# Patient Record
Sex: Male | Born: 1935 | Race: White | Hispanic: No | Smoking: Never smoker
Health system: Southern US, Community
[De-identification: ages and names within clinical notes are randomized; demographics above are authoritative.]

## PROBLEM LIST (undated history)

## (undated) DIAGNOSIS — N183 Chronic kidney disease, stage 3 unspecified: Secondary | ICD-10-CM

## (undated) DIAGNOSIS — K219 Gastro-esophageal reflux disease without esophagitis: Secondary | ICD-10-CM

## (undated) DIAGNOSIS — E119 Type 2 diabetes mellitus without complications: Secondary | ICD-10-CM

## (undated) DIAGNOSIS — Z8659 Personal history of other mental and behavioral disorders: Secondary | ICD-10-CM

## (undated) DIAGNOSIS — I10 Essential (primary) hypertension: Secondary | ICD-10-CM

## (undated) HISTORY — PX: TONSILLECTOMY: SUR1361

## (undated) HISTORY — PX: COLONOSCOPY: SHX174

---

## 2003-11-17 ENCOUNTER — Ambulatory Visit: Payer: Self-pay | Admitting: Internal Medicine

## 2006-03-01 ENCOUNTER — Ambulatory Visit: Payer: Self-pay | Admitting: Gastroenterology

## 2009-03-13 ENCOUNTER — Ambulatory Visit: Payer: Self-pay | Admitting: Internal Medicine

## 2014-05-15 ENCOUNTER — Ambulatory Visit
Admit: 2014-05-15 | Disposition: A | Discharge status: Home / Self Care | Payer: Self-pay | Attending: Family Medicine | Admitting: Family Medicine

## 2014-05-18 ENCOUNTER — Ambulatory Visit
Admit: 2014-05-18 | Disposition: A | Discharge status: Home / Self Care | Payer: Self-pay | Attending: Family Medicine | Admitting: Family Medicine

## 2014-07-03 ENCOUNTER — Ambulatory Visit: Payer: Medicare Other | Admitting: Dietician

## 2014-08-01 ENCOUNTER — Encounter: Payer: Self-pay | Admitting: Dietician

## 2016-08-18 ENCOUNTER — Other Ambulatory Visit: Payer: Self-pay | Admitting: Nephrology

## 2016-08-18 DIAGNOSIS — R188 Other ascites: Secondary | ICD-10-CM

## 2016-08-20 ENCOUNTER — Other Ambulatory Visit: Payer: Self-pay | Admitting: Nephrology

## 2016-08-20 ENCOUNTER — Ambulatory Visit
Admission: RE | Admit: 2016-08-20 | Discharge: 2016-08-20 | Disposition: A | Discharge status: Home / Self Care | Payer: Medicare HMO | Source: Ambulatory Visit | Attending: Nephrology | Admitting: Nephrology

## 2016-08-20 DIAGNOSIS — R188 Other ascites: Secondary | ICD-10-CM

## 2016-09-07 ENCOUNTER — Emergency Department: Payer: Medicare HMO

## 2016-09-07 ENCOUNTER — Emergency Department
Admission: EM | Admit: 2016-09-07 | Discharge: 2016-09-07 | Disposition: A | Discharge status: Home / Self Care | Payer: Medicare HMO | Attending: Emergency Medicine | Admitting: Emergency Medicine

## 2016-09-07 ENCOUNTER — Other Ambulatory Visit: Payer: Self-pay

## 2016-09-07 ENCOUNTER — Encounter: Payer: Self-pay | Admitting: Emergency Medicine

## 2016-09-07 DIAGNOSIS — R55 Syncope and collapse: Secondary | ICD-10-CM | POA: Diagnosis not present

## 2016-09-07 DIAGNOSIS — Y999 Unspecified external cause status: Secondary | ICD-10-CM | POA: Diagnosis not present

## 2016-09-07 DIAGNOSIS — Y929 Unspecified place or not applicable: Secondary | ICD-10-CM | POA: Insufficient documentation

## 2016-09-07 DIAGNOSIS — N189 Chronic kidney disease, unspecified: Secondary | ICD-10-CM | POA: Insufficient documentation

## 2016-09-07 DIAGNOSIS — N289 Disorder of kidney and ureter, unspecified: Secondary | ICD-10-CM

## 2016-09-07 DIAGNOSIS — E871 Hypo-osmolality and hyponatremia: Secondary | ICD-10-CM | POA: Insufficient documentation

## 2016-09-07 DIAGNOSIS — S0993XA Unspecified injury of face, initial encounter: Secondary | ICD-10-CM | POA: Insufficient documentation

## 2016-09-07 DIAGNOSIS — S32591A Other specified fracture of right pubis, initial encounter for closed fracture: Secondary | ICD-10-CM | POA: Insufficient documentation

## 2016-09-07 DIAGNOSIS — Y939 Activity, unspecified: Secondary | ICD-10-CM | POA: Diagnosis not present

## 2016-09-07 DIAGNOSIS — E1165 Type 2 diabetes mellitus with hyperglycemia: Secondary | ICD-10-CM | POA: Diagnosis not present

## 2016-09-07 DIAGNOSIS — W010XXA Fall on same level from slipping, tripping and stumbling without subsequent striking against object, initial encounter: Secondary | ICD-10-CM | POA: Diagnosis not present

## 2016-09-07 DIAGNOSIS — R739 Hyperglycemia, unspecified: Secondary | ICD-10-CM

## 2016-09-07 DIAGNOSIS — S3993XA Unspecified injury of pelvis, initial encounter: Secondary | ICD-10-CM | POA: Diagnosis present

## 2016-09-07 DIAGNOSIS — R4020 Unspecified coma: Secondary | ICD-10-CM

## 2016-09-07 HISTORY — DX: Type 2 diabetes mellitus without complications: E11.9

## 2016-09-07 LAB — BASIC METABOLIC PANEL
ANION GAP: 8 (ref 5–15)
BUN: 41 mg/dL — ABNORMAL HIGH (ref 6–20)
CALCIUM: 9.1 mg/dL (ref 8.9–10.3)
CO2: 26 mmol/L (ref 22–32)
Chloride: 99 mmol/L — ABNORMAL LOW (ref 101–111)
Creatinine, Ser: 1.88 mg/dL — ABNORMAL HIGH (ref 0.61–1.24)
GFR, EST AFRICAN AMERICAN: 36 mL/min — AB (ref >60)
GFR, EST NON AFRICAN AMERICAN: 31 mL/min — AB (ref >60)
Glucose, Bld: 376 mg/dL — ABNORMAL HIGH (ref 65–99)
Potassium: 4.5 mmol/L (ref 3.5–5.1)
Sodium: 133 mmol/L — ABNORMAL LOW (ref 135–145)

## 2016-09-07 LAB — URINALYSIS, COMPLETE (UACMP) WITH MICROSCOPIC
BILIRUBIN URINE: NEGATIVE
Bacteria, UA: NONE SEEN
Glucose, UA: >=500 mg/dL — AB
HGB URINE DIPSTICK: NEGATIVE
KETONES UR: NEGATIVE mg/dL
LEUKOCYTES UA: NEGATIVE
NITRITE: NEGATIVE
PROTEIN: NEGATIVE mg/dL
Specific Gravity, Urine: 1.017 (ref 1.005–1.030)
Squamous Epithelial / LPF: NONE SEEN
pH: 5 (ref 5.0–8.0)

## 2016-09-07 LAB — CBC
HCT: 40.1 % (ref 40.0–52.0)
HEMOGLOBIN: 13.9 g/dL (ref 13.0–18.0)
MCH: 33 pg (ref 26.0–34.0)
MCHC: 34.7 g/dL (ref 32.0–36.0)
MCV: 95.3 fL (ref 80.0–100.0)
Platelets: 151 10*3/uL (ref 150–440)
RBC: 4.21 MIL/uL — AB (ref 4.40–5.90)
RDW: 14.4 % (ref 11.5–14.5)
WBC: 7.4 10*3/uL (ref 3.8–10.6)

## 2016-09-07 LAB — TROPONIN I

## 2016-09-07 MED ORDER — ACETAMINOPHEN 500 MG PO TABS
1000.0000 mg | ORAL_TABLET | Freq: Once | ORAL | Status: AC
  Administered 2016-09-07: 1000 mg via ORAL
  Filled 2016-09-07: qty 2

## 2016-09-07 NOTE — ED Provider Notes (Signed)
[facility] Emergency Department Provider Note  ____________________________________________  Time seen: Approximately 2:08 PM  I have reviewed the triage vital signs and the nursing notes.   HISTORY  Chief Complaint Hip Pain    HPI Terry Macias is a 83 y.o. male with a history of diabetes who lives alone presenting for continued pelvic pain after fall. The patient reports that one week ago, he went outdoors to get a newspaper and sustained a fall. He does not remember the fall, does not remember the 6 hours after the fall. He remembers waking up in his bed and did not know what happened area at that time, he noted that he had a black eye, and pain in the right groin area. He did not seek any medical attention. He denies any associated chest pain, palpitations, lightheadedness, nausea or vomiting, recent changes in medications. He has not been having any headache, numbness tingling or weakness. Today, the patient presents because of ongoing discomfort when he weightbears that is most localized in the right inguinal crease.   Past Medical History:  Diagnosis Date  . Diabetes mellitus without complication (HCC)     There are no active problems to display for this patient.   No past surgical history on file.  Current Outpatient Rx  . Order #: [id number] Class: Historical Med  . Order #: [id number] Class: Historical Med  . Order #: [id number] Class: Historical Med  . Order #: [id number] Class: Historical Med  . Order #: [id number] Class: Historical Med    Allergies Patient has no known allergies.  No family history on file.  Social History Social History  Substance Use Topics  . Smoking status: Not on file  . Smokeless tobacco: Not on file  . Alcohol use Not on file    Review of Systems Constitutional: No fever/chills.No lightheadedness. Positive loss of consciousness one week ago. Eyes: No visual changes. No blurred or double vision. Positive  ecchymosis of the right eye after fall. ENT: No sore throat. No congestion or rhinorrhea. No ear pain. Cardiovascular: Denies chest pain. Denies palpitations. Respiratory: Denies shortness of breath.  No cough. Gastrointestinal: No abdominal pain.  No nausea, no vomiting.  No diarrhea.  No constipation. Genitourinary: Negative for dysuria. Musculoskeletal: Negative for back pain. No neck pain. Positive right groin pain. Skin: Negative for rash. Neurological: Negative for headaches. No focal numbness, tingling or weakness. No unsteady gait. No changes in vision or speech. No changes in mental status.    ____________________________________________   PHYSICAL EXAM:  VITAL SIGNS: ED Triage Vitals [09/07/16 1110]  Enc Vitals Group     BP 118/71     Pulse Rate 90     Resp 16     Temp 98.4 F (36.9 C)     Temp Source Oral     SpO2 100 %     Weight 156 lb (70.8 kg)     Height 5\' 9"  (1.753 m)     Head Circumference      Peak Flow      Pain Score 9     Pain Loc      Pain Edu?      Excl. in GC?     Constitutional: Alert and oriented. Well appearing and in no acute distress. Answers questions appropriately.GCS is 15 Eyes: Conjunctivae are normal.  EOMI. No scleral icterus. Positive ecchymosis with old yellow and green discoloration around the right orbit. No evidence of extraocular entrapment. PERRLA. Head: No Battle sign. See exam above. Nose: No congestion/rhinnorhea.  No swelling over the nose. No septal hematoma. Mouth/Throat: Mucous membranes are moist. No dental injury or malocclusion. Neck: No stridor.  Supple.  No midline C-spine tenderness to palpation, step-offs or deformities. Full range of motion without pain. Cardiovascular: Normal rate, regular rhythm. No murmurs, rubs or gallops.  Respiratory: Normal respiratory effort.  No accessory muscle use or retractions. Lungs CTAB.  No wheezes, rales or ronchi. Gastrointestinal: Soft, nontender and nondistended.  No guarding or  rebound.  No peritoneal signs. Musculoskeletal: The patient has full range of motion of the left hip, bilateral knees, and bilateral ankles without pain. He does have some pain with range of motion of the right hip. He does not have any pain over the right greater trochanter but does have pain with palpation over the right inguinal crease. The patient has normal right DP and PT pulses. Normal sensation to light touch in the right lower extremity. No evidence of upper extremity injury. No midline C-spine, T-spine, or L-spine tenderness to palpation, step-offs or deformities. No LE edema. No ttp in the calves or palpable cords.  Negative Homan's sign. Neurologic:  A&Ox3.  Speech is clear.  Face and smile are symmetric.  EOMI.  Moves all extremities well. Skin:  Skin is warm, dry and intact. No rash noted. Psychiatric: Mood and affect are normal. Speech and behavior are normal.  Normal judgement.  ____________________________________________   LABS (all labs ordered are listed, but only abnormal results are displayed)  Labs Reviewed  BASIC METABOLIC PANEL - Abnormal; Notable for the following:       Result Value   Sodium 133 (*)    Chloride 99 (*)    Glucose, Bld 376 (*)    BUN 41 (*)    Creatinine, Ser 1.88 (*)    GFR calc non Af Amer 31 (*)    GFR calc Af Amer 36 (*)    All other components within normal limits  CBC - Abnormal; Notable for the following:    RBC 4.21 (*)    All other components within normal limits  URINALYSIS, COMPLETE (UACMP) WITH MICROSCOPIC - Abnormal; Notable for the following:    Color, Urine YELLOW (*)    APPearance CLEAR (*)    Glucose, UA >=500 (*)    All other components within normal limits  TROPONIN I   ____________________________________________  EKG  ED ECG REPORT I, Rockne MenghiniNorman, Anne-Caroline, the attending physician, personally viewed and interpreted this ECG.   Date: 09/07/2016  EKG Time: 1327  Rate: 66  Rhythm: normal sinus rhythm; + RBBB  Axis:  normal  Intervals:none  ST&T Change: No STEMI  ____________________________________________  RADIOLOGY  Dg Chest 2 View  Result Date: 09/07/2016 CLINICAL DATA:  83 year old who fell approximately 1 week ago, complaining of persistent chest pain. Patient amnestic to the fall. Initial imaging encounter. EXAM: CHEST  2 VIEW COMPARISON:  None. FINDINGS: Cardiac silhouette normal in size. Thoracic aorta tortuous and atherosclerotic. Hilar and mediastinal contours otherwise unremarkable. Mild hyperinflation with increase in the AP diameter of the chest. Linear scar or atelectasis in the lower lobes bilaterally and in the right middle lobe. Lungs otherwise clear. Pulmonary vascularity normal. Bronchovascular markings normal. No pleural effusions. Exaggeration of the usual thoracic kyphosis. IMPRESSION: 1. Mild hyperinflation indicating COPD and/or asthma. Linear scarring or atelectasis in the lower lobes and right middle lobe. No acute cardiopulmonary disease otherwise. 2. Thoracic aortic atherosclerosis. Electronically Signed   By: Hulan Saashomas  Lawrence M.D.   On: 09/07/2016 12:22   Ct Head  Wo Contrast  Result Date: 09/07/2016 CLINICAL DATA:  Pt reports fall 1 week ago, continued pain to right hip with walking. Pt also reports hitting head, old bruising noted to right eye. Pt reports waking up around 6am and getting the newpaper, then reports waking up in bed around 1.*comment was truncated* EXAM: CT HEAD WITHOUT CONTRAST CT CERVICAL SPINE WITHOUT CONTRAST TECHNIQUE: Multidetector CT imaging of the head and cervical spine was performed following the standard protocol without intravenous contrast. Multiplanar CT image reconstructions of the cervical spine were also generated. COMPARISON:  None. FINDINGS: CT HEAD FINDINGS Brain: Mild generalized age related parenchymal atrophy with commensurate dilatation of the ventricles and sulci. No mass, hemorrhage, edema or other evidence of acute parenchymal abnormality. No  extra-axial hemorrhage. Vascular: There are chronic calcified atherosclerotic changes of the large vessels at the skull base. No unexpected hyperdense vessel. Skull: Normal. Negative for fracture or focal lesion. Sinuses/Orbits: No acute finding. Other: None. CT CERVICAL SPINE FINDINGS Alignment: Normal.  No vertebral body subluxation. Skull base and vertebrae: No fracture line or displaced fracture fragment identified. Facet joints appear intact and normally aligned throughout. Soft tissues and spinal canal: No prevertebral fluid or swelling. No visible canal hematoma. Disc levels: Mild disc desiccations within the lower cervical spine. No significant central canal stenosis seen at any level. Upper chest: Negative. Other: None. IMPRESSION: 1. No acute intracranial abnormality. No intracranial mass, hemorrhage or edema. No skull fracture. 2. No fracture or acute subluxation within the cervical spine. Electronically Signed   By: Bary Richard M.D.   On: 09/07/2016 11:37   Ct Cervical Spine Wo Contrast  Result Date: 09/07/2016 CLINICAL DATA:  Pt reports fall 1 week ago, continued pain to right hip with walking. Pt also reports hitting head, old bruising noted to right eye. Pt reports waking up around 6am and getting the newpaper, then reports waking up in bed around 1.*comment was truncated* EXAM: CT HEAD WITHOUT CONTRAST CT CERVICAL SPINE WITHOUT CONTRAST TECHNIQUE: Multidetector CT imaging of the head and cervical spine was performed following the standard protocol without intravenous contrast. Multiplanar CT image reconstructions of the cervical spine were also generated. COMPARISON:  None. FINDINGS: CT HEAD FINDINGS Brain: Mild generalized age related parenchymal atrophy with commensurate dilatation of the ventricles and sulci. No mass, hemorrhage, edema or other evidence of acute parenchymal abnormality. No extra-axial hemorrhage. Vascular: There are chronic calcified atherosclerotic changes of the large  vessels at the skull base. No unexpected hyperdense vessel. Skull: Normal. Negative for fracture or focal lesion. Sinuses/Orbits: No acute finding. Other: None. CT CERVICAL SPINE FINDINGS Alignment: Normal.  No vertebral body subluxation. Skull base and vertebrae: No fracture line or displaced fracture fragment identified. Facet joints appear intact and normally aligned throughout. Soft tissues and spinal canal: No prevertebral fluid or swelling. No visible canal hematoma. Disc levels: Mild disc desiccations within the lower cervical spine. No significant central canal stenosis seen at any level. Upper chest: Negative. Other: None. IMPRESSION: 1. No acute intracranial abnormality. No intracranial mass, hemorrhage or edema. No skull fracture. 2. No fracture or acute subluxation within the cervical spine. Electronically Signed   By: Bary Richard M.D.   On: 09/07/2016 11:37   Ct Hip Right Wo Contrast  Result Date: 09/07/2016 CLINICAL DATA:  Right groin pain secondary to a fall 5 days ago. EXAM: CT OF THE RIGHT HIP WITHOUT CONTRAST TECHNIQUE: Multidetector CT imaging of the right hip was performed according to the standard protocol. Multiplanar CT image reconstructions were also  generated. COMPARISON:  Radiographs dated 09/07/2016 FINDINGS: Bones/Joint/Cartilage There are minimally displaced oblique fractures of the right inferior and superior pubic rami. Proximal right femur is intact. Acetabulum is intact. No joint effusion. Soft tissues There is slight hemorrhage into the soft tissues around the adductor muscles of the right hip adjacent to the inferior pubic ramus fracture. IMPRESSION: Slightly displaced oblique fractures of the right inferior and superior pubic rami. Electronically Signed   By: Francene Boyers M.D.   On: 09/07/2016 14:23   Dg Hip Unilat  With Pelvis 2-3 Views Right  Result Date: 09/07/2016 CLINICAL DATA:  Right hip pain. EXAM: DG HIP (WITH OR WITHOUT PELVIS) 2-3V RIGHT COMPARISON:  None.  FINDINGS: Hip joints and SI joints are symmetric and unremarkable. No acute bony abnormality. Specifically, no fracture, subluxation, or dislocation. Soft tissues are intact. IMPRESSION: No acute bony abnormality. Electronically Signed   By: Charlett Nose M.D.   On: 09/07/2016 12:22    ____________________________________________   PROCEDURES  Procedure(s) performed: None  Procedures  Critical Care performed: No ____________________________________________   INITIAL IMPRESSION / ASSESSMENT AND PLAN / ED COURSE  Pertinent labs & imaging results that were available during my care of the patient were reviewed by me and considered in my medical decision making (see chart for details).  83 y.o. male with diabetes presenting with ongoing right groin pain after a fall last week. Will get a CT scan for further evaluation because the patient's right hip x-ray does not show any acute fracture and I'm concerned about a possible occult fracture, particularly a pelvic fracture given the patient's description and physical exam findings. I am concerned that the patient has a 6 hour period of loss of consciousness, however, this occurred one week ago and he has not had any additional symptoms. Here, he is hemodynamically stable and has no focal neurologic deficits. It no time did he experience any chest pain, shortness of breath or other red flag warnings. Will do a screening EKG, labs including troponin, and reevaluate the patient for final disposition.  ----------------------------------------- 2:57 PM on 09/07/2016 ----------------------------------------- The patient's laboratory studies do show some hyper glycemia without DKA. He is minimally hyponatremic and has received intravenous fluids for this. This will also help his hypoglycemia. He in addition has some mild renal insufficiency. His blood counts does not show any anemia. He does not have a UTI.  History of the workup does show an inferior and  superior pubic ramus fracture on the right. I spoke with Dr. Ernest Pine, the orthopedic surgeon on-call, who recommends weight-bear as tolerated and done as needed. He may follow up with his PCP or orthopedics as needed. His remaining trauma workup is negative, including a reassuring chest x-ray, CT head and CT C-spine.  At this time, the patient is stable for discharge. He understands return precautions as well as follow-up instructions.   ____________________________________________  FINAL CLINICAL IMPRESSION(S) / ED DIAGNOSES  Final diagnoses:  Closed fracture of ramus of right pubis, initial encounter (HCC)  Loss of consciousness (HCC)  Facial injury, initial encounter  Hyperglycemia  Hyponatremia  Renal insufficiency         NEW MEDICATIONS STARTED DURING THIS VISIT:  New Prescriptions   No medications on file      Rockne Menghini, MD 09/07/16 1459

## 2016-09-07 NOTE — ED Notes (Signed)
The EKG was completed and signed by Dr. Malinda. The EKG was also exported into the system. 

## 2016-09-07 NOTE — ED Notes (Addendum)
Awaiting troponin level prior to discharge - called lab and troponin is in process

## 2016-09-07 NOTE — Discharge Instructions (Signed)
Today you have to parts of your pelvis that are fractured. These do not need surgery, or any kind of immobilization. You may weight-bear as tolerated but please make sure you are treating her pain well so that you do not fall due to pain. If you have pain when you're sitting, you may use a doughnut shaped pillow. He may follow up with your primary care physician, or with the orthopedist if you wish.  Please drink plenty of fluid over the next couple of days. Please have your primary care physician recheck your kidney function, your blood sugar, and your sodium level.  We did not find any cause for your loss of consciousness today. Please make an appointment with your primary care physician tomorrow to let him know, and for full evaluation.  Return to the emergency department if you develop lightheadedness or fainting, severe pain, shortness of breath, or any other symptoms concerning to you.

## 2016-09-07 NOTE — ED Notes (Signed)
Patient transported to CT 

## 2016-09-07 NOTE — ED Triage Notes (Signed)
Pt reports fall 1 week ago, continued pain to right hip with walking. Pt also reports hitting head, old bruising noted to right eye. Pt reports waking up around 6am and getting the newpaper, then reports waking up in bed around 1200 noon with the hit pain and black eye.

## 2016-09-07 NOTE — ED Notes (Signed)
Approx 1 week ago pt was outside getting newspaper and he next remembers he was in the bed with pain in right hip and black eye to right side. Pt reports pain with ambulation, pt able to move right hip.

## 2016-09-07 NOTE — ED Notes (Signed)
Called lab to follow up on troponin level and was told tht it would be 20 more min

## 2018-02-23 IMAGING — CR DG HIP (WITH OR WITHOUT PELVIS) 2-3V*R*
1 series · 3 of 3 positions shown · non-contrast
Comparison: None.

CLINICAL DATA: Right hip pain.

EXAM:
DG HIP (WITH OR WITHOUT PELVIS) 2-3V RIGHT

[Series 1: dg hip unilat w or w/o pelvis 2-3 views  · non-contrast · 0.14mm/px · 3 of 3 slices shown]
[im 1/3]
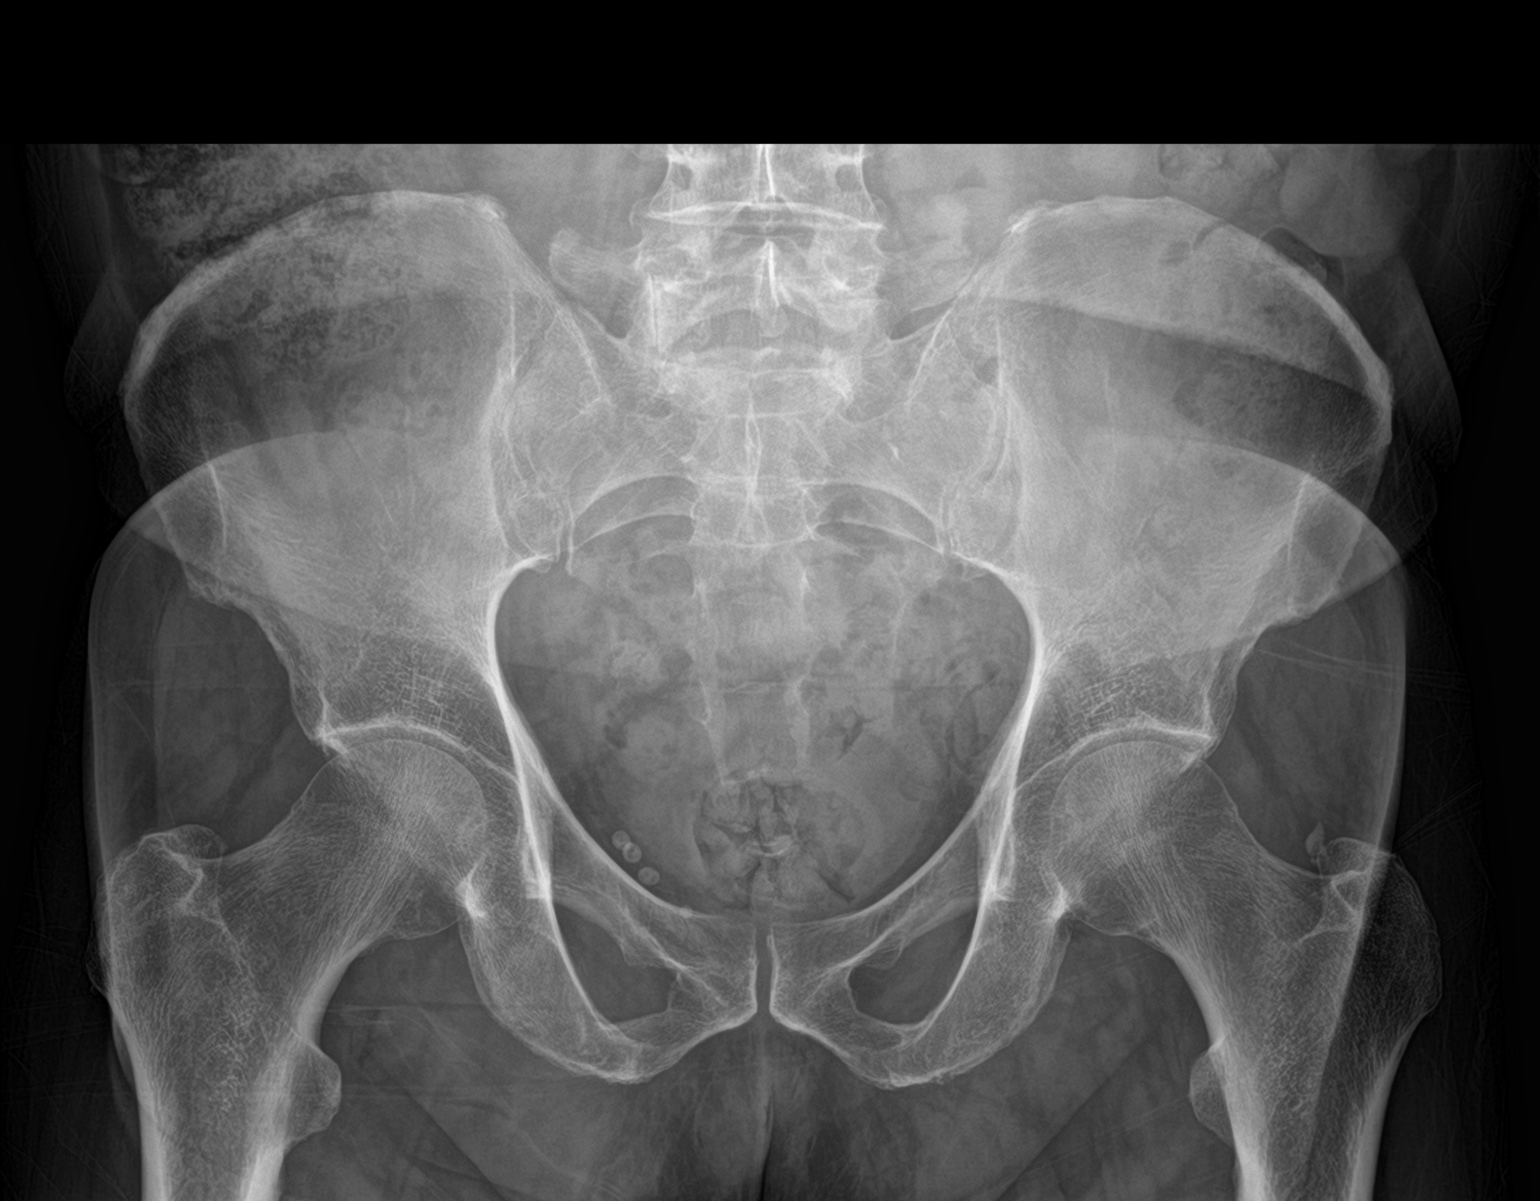
[im 2/3]
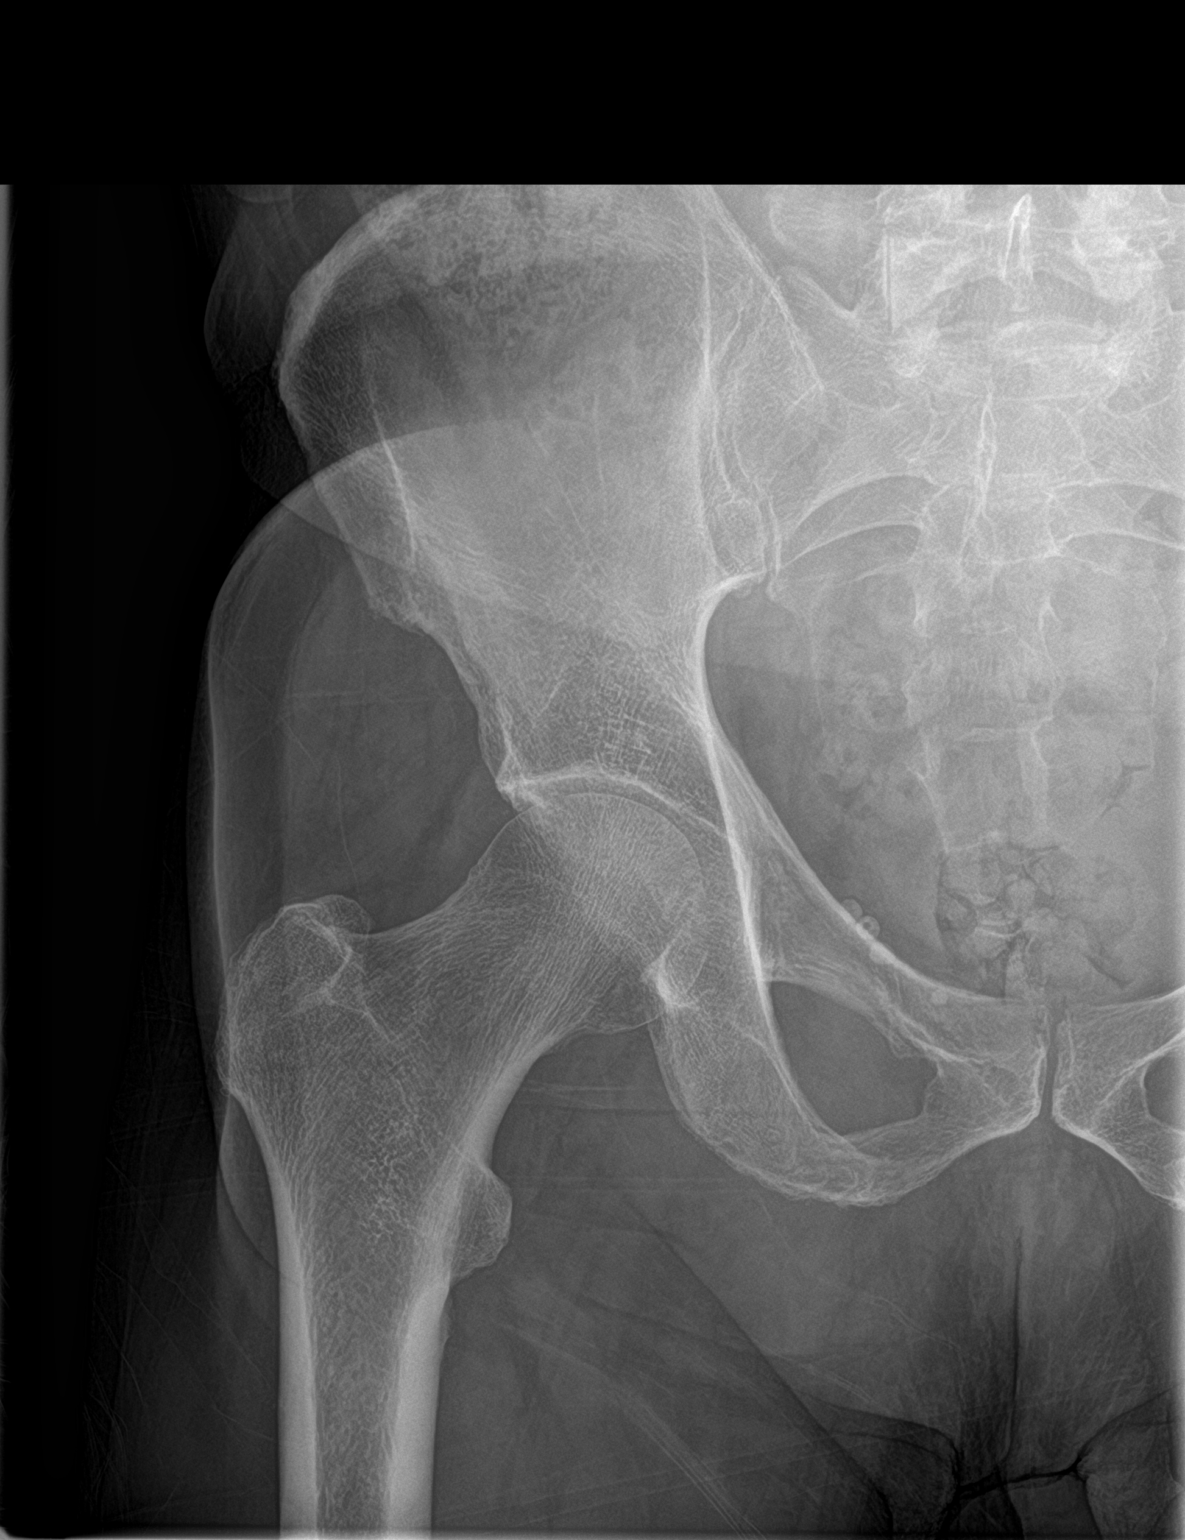
[im 3/3]
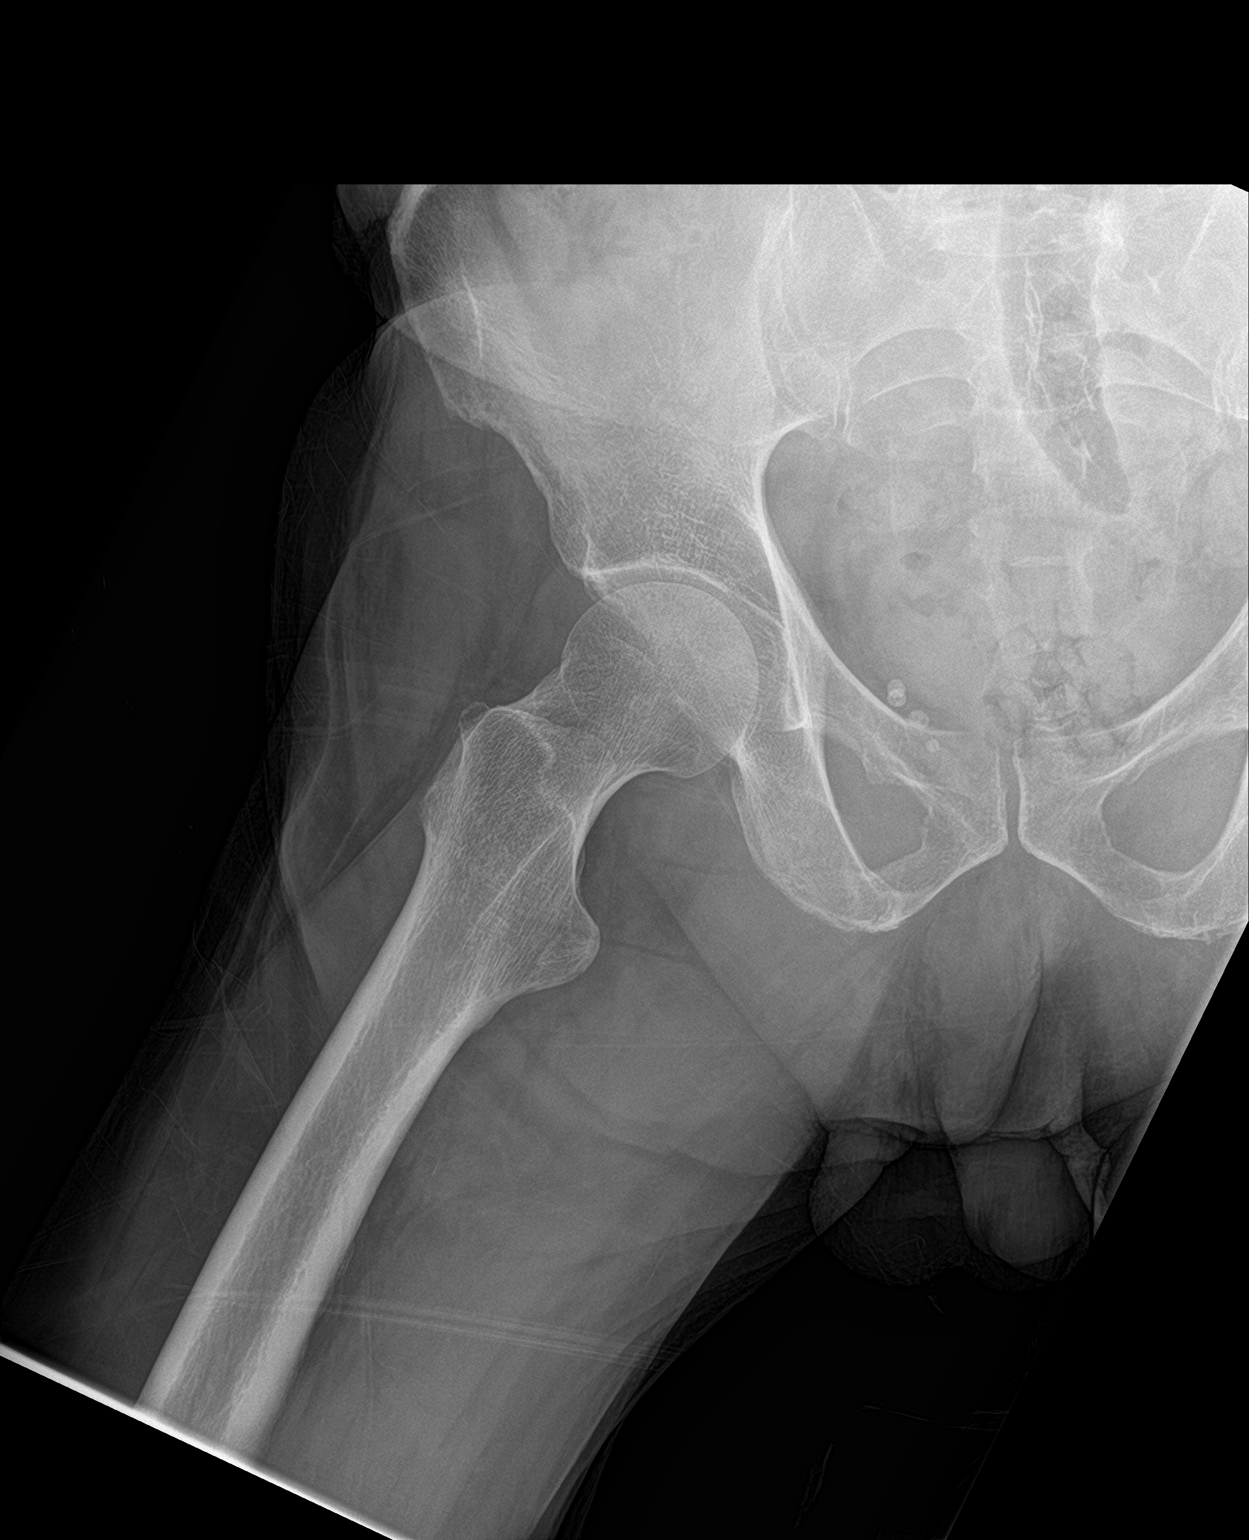

[3 of 3 positions shown; findings below may reference images not displayed]

FINDINGS: Hip joints and SI joints are symmetric and unremarkable. No acute
bony abnormality. Specifically, no fracture, subluxation, or
dislocation. Soft tissues are intact.
IMPRESSION: No acute bony abnormality.

## 2018-02-23 IMAGING — CR DG CHEST 2V
1 series · 2 of 2 positions shown · non-contrast
Comparison: None.

CLINICAL DATA: 86-year-old who fell approximately 1 week ago,
complaining of persistent chest pain. Patient amnestic to the fall.
Initial imaging encounter.

EXAM:
CHEST  2 VIEW

[Series 1: dg chest 2 view · 0.14mm/px · 2 of 2 slices shown]
[im 1/2]
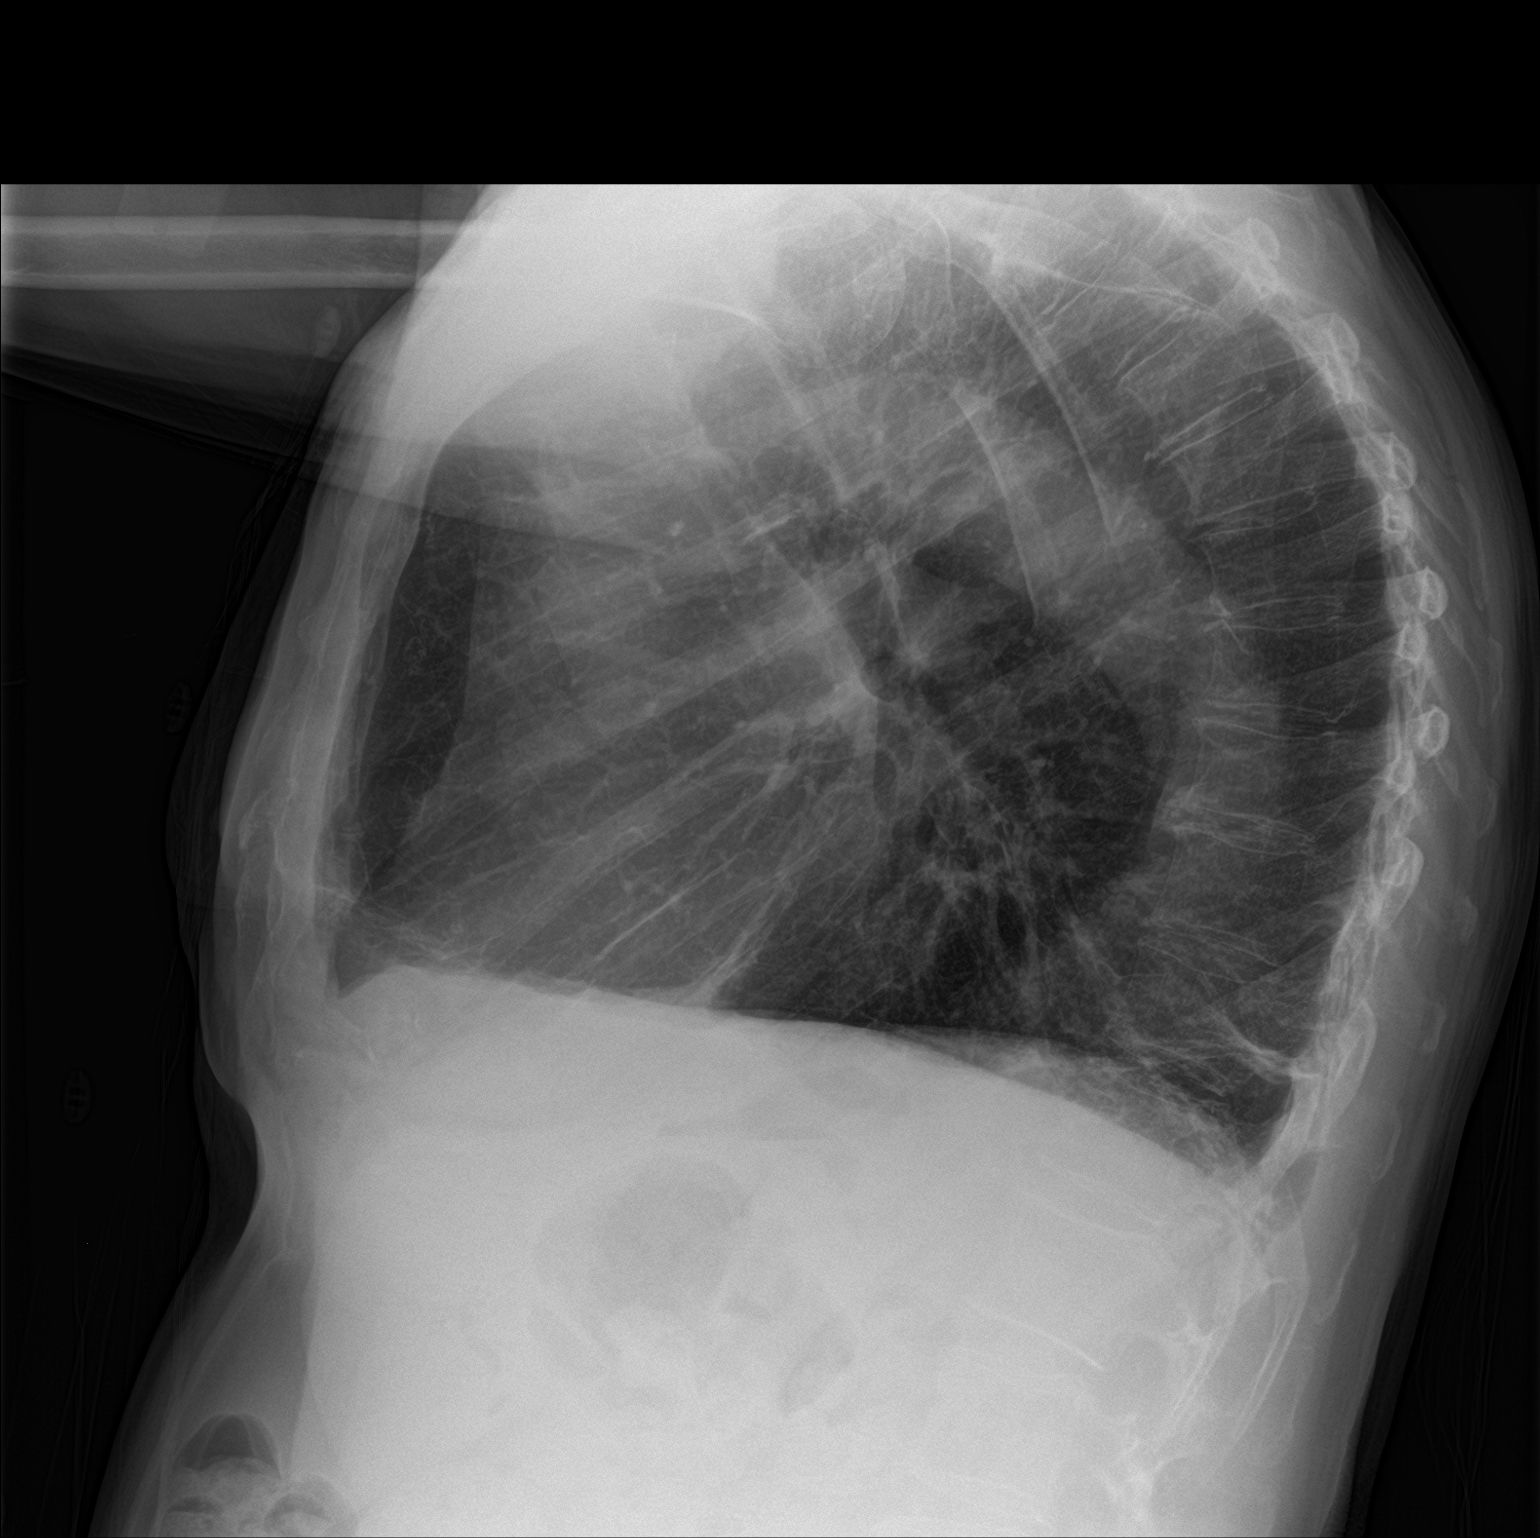
[im 2/2]
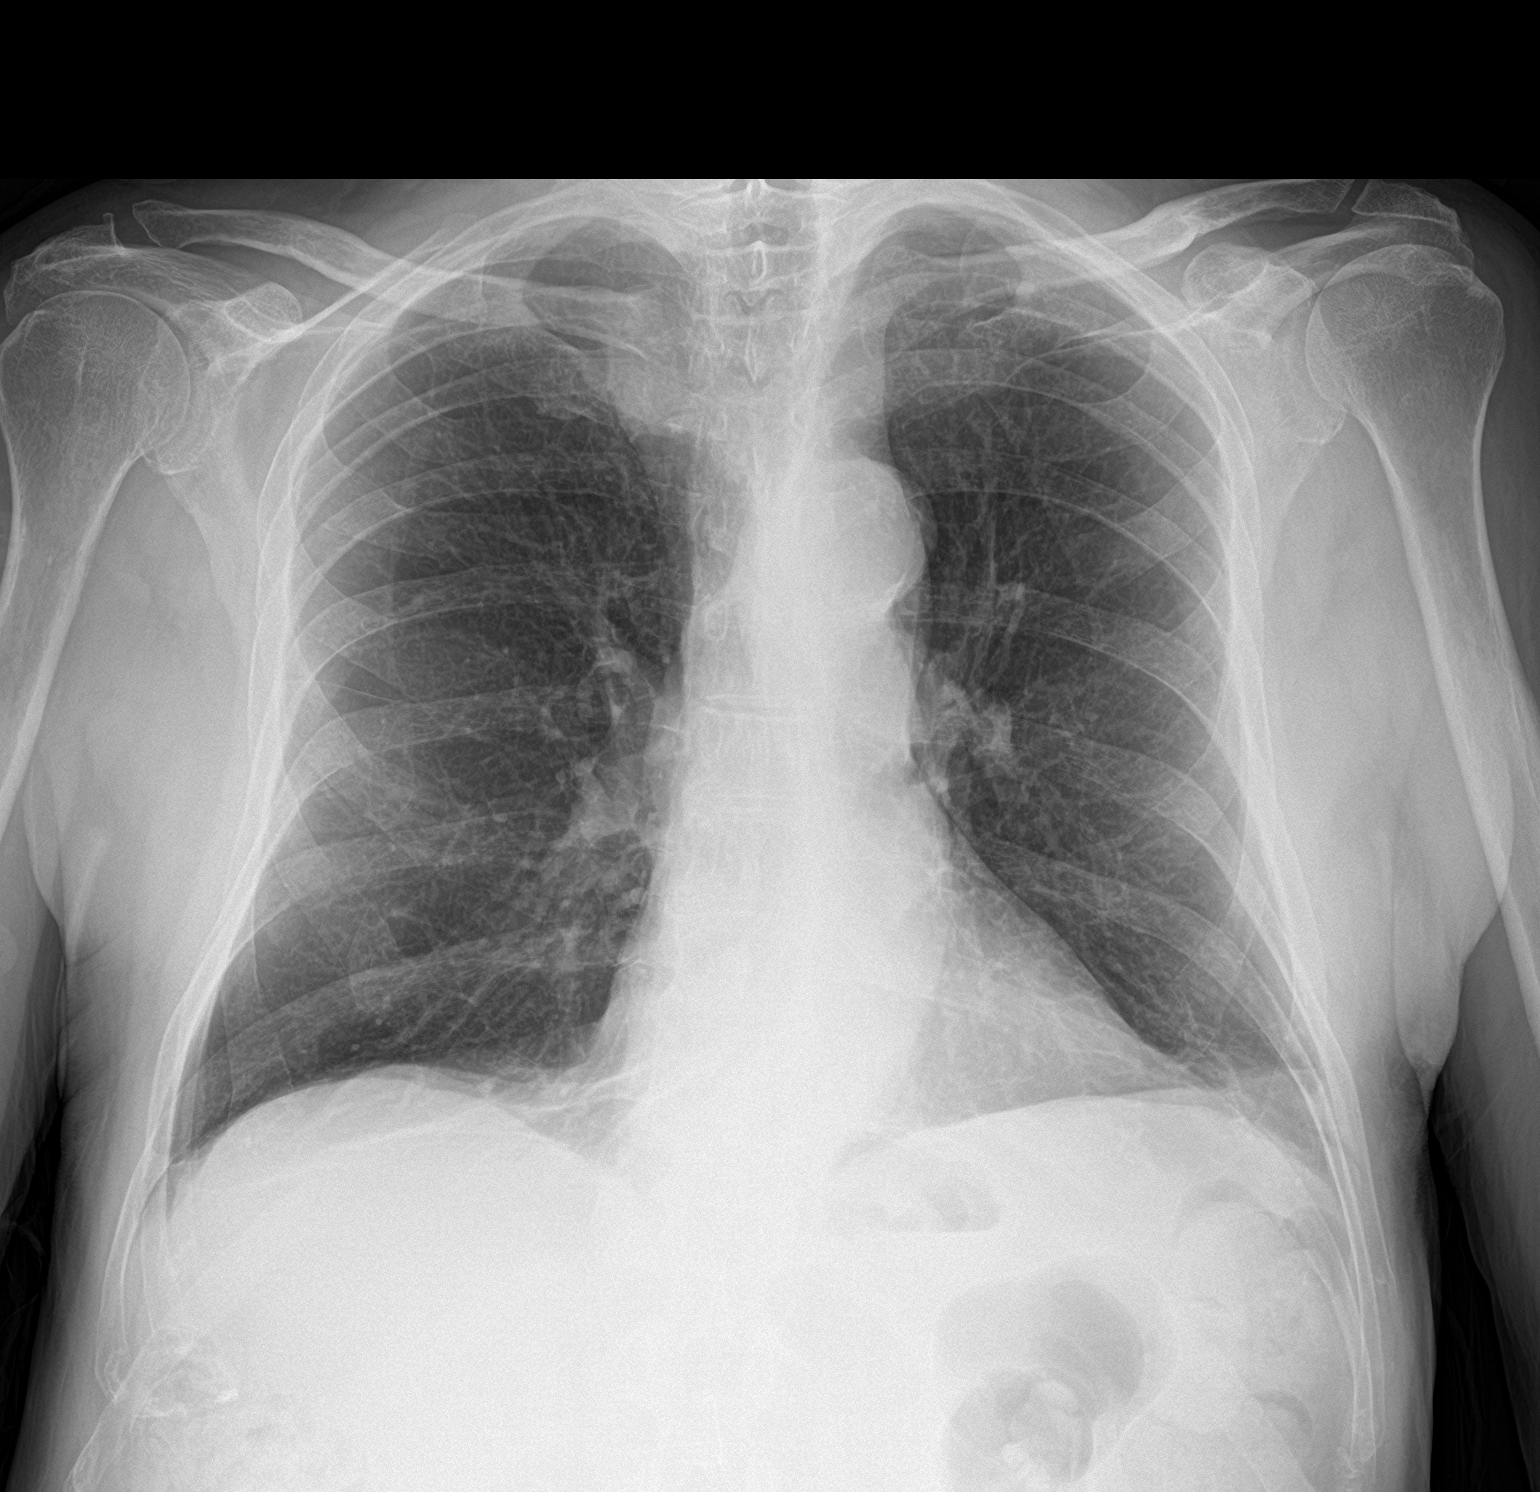

[2 of 2 positions shown; findings below may reference images not displayed]

FINDINGS: Cardiac silhouette normal in size. Thoracic aorta tortuous and
atherosclerotic. Hilar and mediastinal contours otherwise
unremarkable. Mild hyperinflation with increase in the AP diameter
of the chest. Linear scar or atelectasis in the lower lobes
bilaterally and in the right middle lobe. Lungs otherwise clear.
Pulmonary vascularity normal. Bronchovascular markings normal. No
pleural effusions. Exaggeration of the usual thoracic kyphosis.
IMPRESSION: 1. Mild hyperinflation indicating COPD and/or asthma. Linear
scarring or atelectasis in the lower lobes and right middle lobe. No
acute cardiopulmonary disease otherwise.
2. Thoracic aortic atherosclerosis.

## 2020-02-12 ENCOUNTER — Other Ambulatory Visit: Payer: Self-pay | Admitting: Family Medicine

## 2020-02-12 DIAGNOSIS — R1903 Right lower quadrant abdominal swelling, mass and lump: Secondary | ICD-10-CM

## 2020-02-13 ENCOUNTER — Ambulatory Visit
Admission: RE | Admit: 2020-02-13 | Discharge: 2020-02-13 | Disposition: A | Discharge status: Home / Self Care | Payer: Medicare HMO | Source: Ambulatory Visit | Attending: Family Medicine | Admitting: Family Medicine

## 2020-02-13 ENCOUNTER — Other Ambulatory Visit: Payer: Self-pay

## 2020-02-13 DIAGNOSIS — R1903 Right lower quadrant abdominal swelling, mass and lump: Secondary | ICD-10-CM | POA: Diagnosis present

## 2020-02-15 ENCOUNTER — Ambulatory Visit: Payer: Self-pay | Admitting: Surgery

## 2020-02-15 NOTE — H&P (Signed)
Subjective:   CC: Non-recurrent bilateral inguinal hernia without obstruction or gangrene [K40.20]  HPI:  Terry Macias is a 86 y.o. male who was referred by Marisue Ivan, MD for evaluation of above. Asymptomatic but noticed bulge few days ago. Lump is reducible.    Past Medical History:  has a past medical history of Chronic kidney disease (can"t recall), Depression, Diabetes (CMS-HCC), GERD (gastroesophageal reflux disease), History of diverticulosis, and Hypertension.  Past Surgical History:  Past Surgical History:  Procedure Laterality Date  . COLONOSCOPY  January 2008  . TONSILLECTOMY      Family History: family history includes Diabetes in his mother; Diabetes type II in his mother; Heart disease in his brother, brother, and mother; Heart valve disease in his mother; Lung cancer in his brother; Stroke in his father.  Social History:  reports that he quit smoking about 37 years ago. His smoking use included cigarettes. He started smoking about 52 years ago. He smoked 0.00 packs per day for 15.00 years. He has never used smokeless tobacco. He reports that he does not drink alcohol and does not use drugs.  Current Medications: has a current medication list which includes the following prescription(s): alpha lipoic acid, amlodipine, aspirin, atorvastatin, atorvastatin, cholecalciferol, famotidine, flash glucose scanning, flash glucose sensor, glimepiride, insulin aspart, levemir flextouch u-100 insuln, lisinopril, onetouch ultra blue test strip, paroxetine, and ulticare pen needle.  Allergies:  Allergies as of 02/15/2020 - Reviewed 02/15/2020  Allergen Reaction Noted  . Trulicity [dulaglutide] Other (See Comments) 10/04/2017    ROS:  A 15 point review of systems was performed and pertinent positives and negatives noted in HPI   Objective:     BP 151/81   Pulse 87   Ht 166.9 cm (5' 5.7")   Wt 68.9 kg (152 lb)   BMI 24.76 kg/m   Constitutional :  alert, appears  stated age, cooperative and no distress  Lymphatics/Throat:  no asymmetry, masses, or scars  Respiratory:  clear to auscultation bilaterally  Cardiovascular:  regular rate and rhythm  Gastrointestinal: soft, non-tender; bowel sounds normal; no masses,  no organomegaly. inguinal hernia noted.  large, reducible, no overlying skin changes and right side.  very small left side  Musculoskeletal: Steady gait and movement  Skin: Cool and moist  Psychiatric: Normal affect, non-agitated, not confused       LABS:  n/a   RADS: n/a Assessment:       Non-recurrent bilateral inguinal hernia without obstruction or gangrene [K40.20] R>L  Plan:     1. Non-recurrent bilateral inguinal hernia without obstruction or gangrene [K40.20]   Discussed the risk of surgery including recurrence, which can be up to 50% in the case of incisional or complex hernias, possible use of prosthetic materials (mesh) and the increased risk of mesh infxn if used, bleeding, chronic pain, post-op infxn, post-op SBO or ileus, and possible re-operation to address said risks. The risks of general anesthetic, if used, includes MI, CVA, sudden death or even reaction to anesthetic medications also discussed. Alternatives include continued observation.  Benefits include possible symptom relief, prevention of incarceration, strangulation, enlargement in size over time, and the risk of emergency surgery in the face of strangulation.   Typical post-op recovery time of 3-5 days with 2 weeks of activity restrictions were also discussed.  ED return precautions given for sudden increase in pain, size of hernia with accompanying fever, nausea, and/or vomiting.  The patient verbalized understanding and all questions were answered to the patient's satisfaction.   2.  Patient has elected to proceed with surgical treatment. Procedure will be scheduled.  Written consent was obtained.. bilateral, robotic assisted laparoscopic.  Will only fix right  side if we need to covert to open due to size    

## 2020-02-15 NOTE — H&P (View-Only) (Signed)
Subjective:   CC: Non-recurrent bilateral inguinal hernia without obstruction or gangrene [K40.20]  HPI:  Terry Macias is a 86 y.o. male who was referred by Marisue Ivan, MD for evaluation of above. Asymptomatic but noticed bulge few days ago. Lump is reducible.    Past Medical History:  has a past medical history of Chronic kidney disease (can"t recall), Depression, Diabetes (CMS-HCC), GERD (gastroesophageal reflux disease), History of diverticulosis, and Hypertension.  Past Surgical History:  Past Surgical History:  Procedure Laterality Date  . COLONOSCOPY  January 2008  . TONSILLECTOMY      Family History: family history includes Diabetes in his mother; Diabetes type II in his mother; Heart disease in his brother, brother, and mother; Heart valve disease in his mother; Lung cancer in his brother; Stroke in his father.  Social History:  reports that he quit smoking about 37 years ago. His smoking use included cigarettes. He started smoking about 52 years ago. He smoked 0.00 packs per day for 15.00 years. He has never used smokeless tobacco. He reports that he does not drink alcohol and does not use drugs.  Current Medications: has a current medication list which includes the following prescription(s): alpha lipoic acid, amlodipine, aspirin, atorvastatin, atorvastatin, cholecalciferol, famotidine, flash glucose scanning, flash glucose sensor, glimepiride, insulin aspart, levemir flextouch u-100 insuln, lisinopril, onetouch ultra blue test strip, paroxetine, and ulticare pen needle.  Allergies:  Allergies as of 02/15/2020 - Reviewed 02/15/2020  Allergen Reaction Noted  . Trulicity [dulaglutide] Other (See Comments) 10/04/2017    ROS:  A 15 point review of systems was performed and pertinent positives and negatives noted in HPI   Objective:     BP 151/81   Pulse 87   Ht 166.9 cm (5' 5.7")   Wt 68.9 kg (152 lb)   BMI 24.76 kg/m   Constitutional :  alert, appears  stated age, cooperative and no distress  Lymphatics/Throat:  no asymmetry, masses, or scars  Respiratory:  clear to auscultation bilaterally  Cardiovascular:  regular rate and rhythm  Gastrointestinal: soft, non-tender; bowel sounds normal; no masses,  no organomegaly. inguinal hernia noted.  large, reducible, no overlying skin changes and right side.  very small left side  Musculoskeletal: Steady gait and movement  Skin: Cool and moist  Psychiatric: Normal affect, non-agitated, not confused       LABS:  n/a   RADS: n/a Assessment:       Non-recurrent bilateral inguinal hernia without obstruction or gangrene [K40.20] R>L  Plan:     1. Non-recurrent bilateral inguinal hernia without obstruction or gangrene [K40.20]   Discussed the risk of surgery including recurrence, which can be up to 50% in the case of incisional or complex hernias, possible use of prosthetic materials (mesh) and the increased risk of mesh infxn if used, bleeding, chronic pain, post-op infxn, post-op SBO or ileus, and possible re-operation to address said risks. The risks of general anesthetic, if used, includes MI, CVA, sudden death or even reaction to anesthetic medications also discussed. Alternatives include continued observation.  Benefits include possible symptom relief, prevention of incarceration, strangulation, enlargement in size over time, and the risk of emergency surgery in the face of strangulation.   Typical post-op recovery time of 3-5 days with 2 weeks of activity restrictions were also discussed.  ED return precautions given for sudden increase in pain, size of hernia with accompanying fever, nausea, and/or vomiting.  The patient verbalized understanding and all questions were answered to the patient's satisfaction.   2.  Patient has elected to proceed with surgical treatment. Procedure will be scheduled.  Written consent was obtained.. bilateral, robotic assisted laparoscopic.  Will only fix right  side if we need to covert to open due to size

## 2020-02-20 NOTE — Patient Instructions (Addendum)
Your procedure is scheduled on: Friday 02/23/20.  Report to THE FIRST FLOOR REGISTRATION DESK IN THE MEDICAL MALL ON THE MORNING OF SURGERY FIRST, THEN YOU WILL CHECK IN AT THE SURGERY INFORMATION DESK LOCATED OUTSIDE THE SAME DAY SURGERY DEPARTMENT LOCATED ON 2ND FLOOR MEDICAL MALL ENTRANCE.  To find out your arrival time please call [phone] between 1PM - 3PM on  Thursday 02/22/20.   Remember: Instructions that are not followed completely may result in serious medical risk, up to and including death, or upon the discretion of your surgeon and anesthesiologist your surgery may need to be rescheduled.     __X__ 1. Do not eat food after midnight the night before your procedure.                 No gum chewing or hard candies. You may drink SUGAR FREE clear liquids up to 2 hours                 before you are scheduled to arrive for your surgery- DO NOT drink clear                 liquids within 2 hours of the start of your surgery.                __X__2.  On the morning of surgery brush your teeth with toothpaste and water, you may rinse your mouth with mouthwash if you wish.  Do not swallow any toothpaste or mouthwash.    __X__ 3.  No Alcohol for 24 hours before or after surgery.  __X__ 4.  Do Not Smoke or use e-cigarettes For 24 Hours Prior to Your Surgery.                 Do not use any chewable tobacco products for at least 6 hours prior to                 surgery.  __X__5.  Notify your doctor if there is any change in your medical condition  (cold, fever, infections).      Do NOT wear jewelry, make-up, hairpins, clips or nail polish. Do NOT wear lotions, powders, or perfumes.  Do NOT shave 48 hours prior to surgery. Men may shave face and neck. Do NOT bring valuables to the hospital.     [facility] is not responsible for any belongings or valuables.   Contacts, dentures/partials or body piercings may not be worn into surgery. Bring a case for your contacts, glasses or hearing  aids, a denture cup will be supplied.    Patients discharged the day of surgery will not be allowed to drive home.     __X__ Take these medicines the morning of surgery with A SIP OF WATER:   1. amLODipine (NORVASC) 2. atorvastatin (LIPITOR) 3. famotidine (ZANTAC 360 MAX ST) 20  4. PARoxetine (PAXIL)          __X__ Use CHG Soapas directed  __X__ Take 1/2 of usual insulin dose the night before surgery. No insulin the morning of surgery.   __X__ Stop Anti-inflammatories 7 days before surgery such as Advil, Ibuprofen, Motrin, BC or Goodies Powder, Naprosyn, Naproxen, Aleve, Aspirin, Meloxicam. May take Tylenol if needed for pain or discomfort.   __X__Do not start taking any new herbal supplements or vitamins prior to your procedure.    Wear comfortable clothing (specific to your surgery type) to the hospital.  Plan for stool softeners for home use; pain medications have a  tendency to cause constipation. You can also help prevent constipation by eating foods high in fiber such as fruits and vegetables and drinking plenty of fluids as your diet allows.  After surgery, you can prevent lung complications by doing breathing exercises.Take deep breaths and cough every 1-2 hours. Your doctor may order a device called an Incentive Spirometer to help you take deep breaths.  Please call the Pre-Admissions Testing Department at [phone] if you have any questions about these instructions.

## 2020-02-21 ENCOUNTER — Encounter
Admission: RE | Admit: 2020-02-21 | Discharge: 2020-02-21 | Disposition: A | Discharge status: Home / Self Care | Payer: Medicare HMO | Source: Ambulatory Visit | Attending: Surgery | Admitting: Surgery

## 2020-02-21 ENCOUNTER — Other Ambulatory Visit: Payer: Self-pay

## 2020-02-21 ENCOUNTER — Other Ambulatory Visit
Admission: RE | Admit: 2020-02-21 | Discharge: 2020-02-21 | Disposition: A | Discharge status: Home / Self Care | Payer: Medicare HMO | Source: Ambulatory Visit | Attending: Surgery | Admitting: Surgery

## 2020-02-21 DIAGNOSIS — R799 Abnormal finding of blood chemistry, unspecified: Secondary | ICD-10-CM | POA: Insufficient documentation

## 2020-02-21 DIAGNOSIS — Z01818 Encounter for other preprocedural examination: Secondary | ICD-10-CM | POA: Insufficient documentation

## 2020-02-21 DIAGNOSIS — E119 Type 2 diabetes mellitus without complications: Secondary | ICD-10-CM | POA: Insufficient documentation

## 2020-02-21 DIAGNOSIS — Z20822 Contact with and (suspected) exposure to covid-19: Secondary | ICD-10-CM | POA: Diagnosis not present

## 2020-02-21 HISTORY — DX: Essential (primary) hypertension: I10

## 2020-02-21 HISTORY — DX: Gastro-esophageal reflux disease without esophagitis: K21.9

## 2020-02-21 LAB — BASIC METABOLIC PANEL
Anion gap: 9 (ref 5–15)
BUN: 30 mg/dL — ABNORMAL HIGH (ref 8–23)
CO2: 28 mmol/L (ref 22–32)
Calcium: 8.8 mg/dL — ABNORMAL LOW (ref 8.9–10.3)
Chloride: 99 mmol/L (ref 98–111)
Creatinine, Ser: 1.64 mg/dL — ABNORMAL HIGH (ref 0.61–1.24)
GFR, Estimated: 40 mL/min — ABNORMAL LOW (ref >60)
Glucose, Bld: 326 mg/dL — ABNORMAL HIGH (ref 70–99)
Potassium: 4.4 mmol/L (ref 3.5–5.1)
Sodium: 136 mmol/L (ref 135–145)

## 2020-02-21 LAB — CBC
HCT: 41.5 % (ref 39.0–52.0)
Hemoglobin: 14.2 g/dL (ref 13.0–17.0)
MCH: 33.1 pg (ref 26.0–34.0)
MCHC: 34.2 g/dL (ref 30.0–36.0)
MCV: 96.7 fL (ref 80.0–100.0)
Platelets: 154 10*3/uL (ref 150–400)
RBC: 4.29 MIL/uL (ref 4.22–5.81)
RDW: 12.9 % (ref 11.5–15.5)
WBC: 6.5 10*3/uL (ref 4.0–10.5)
nRBC: 0 % (ref 0.0–0.2)

## 2020-02-21 NOTE — Progress Notes (Signed)
  [facility] Perioperative Services: Pre-Admission/Anesthesia Testing  Abnormal Lab Notification    Date: 02/21/20  Name: MACOY RODWELL MRN:   [id number]  Re: Abnormal labs noted during PAT appointment   Provider(s) Notified: Sung Amabile, DO Notification mode: Routed and/or faxed via [facility]   ABNORMAL LAB VALUE(S): Lab Results  Component Value Date   GLUCOSE 326 (H) 02/21/2020    Notes: Patient with a T2DM diagnosis. He is currently on both oral (glimepiride) and parenteral (Levemir and NovoLog) therapies. Last Hgb A1c was 8.2%  on 11/21/2019. This communication is being sent in order to determine if patient is deemed to have adequate preoperative glycemic control in efforts to reduce his risk of developing SSI.  With that being said, the benefit of improving glycemic control must be weighed against the overall risk associated with delaying a necessary elective surgery for this patient. This is a Personal assistant; no formal response is required.  Quentin Mulling, MSN, APRN, FNP-C, CEN [facility]  Peri-operative Services Nurse Practitioner Phone: [phone] 02/21/20 4:03 PM

## 2020-02-22 ENCOUNTER — Other Ambulatory Visit: Payer: Medicare HMO

## 2020-02-22 LAB — SARS CORONAVIRUS 2 (TAT 6-24 HRS): SARS Coronavirus 2: NEGATIVE

## 2020-02-23 ENCOUNTER — Ambulatory Visit: Payer: Medicare HMO | Admitting: Anesthesiology

## 2020-02-23 ENCOUNTER — Encounter: Payer: Self-pay | Admitting: Surgery

## 2020-02-23 ENCOUNTER — Encounter
Admission: RE | Disposition: A | Discharge status: Home / Self Care | Payer: Self-pay | Source: Home / Self Care | Attending: Surgery

## 2020-02-23 ENCOUNTER — Other Ambulatory Visit: Payer: Self-pay

## 2020-02-23 ENCOUNTER — Ambulatory Visit
Admission: RE | Admit: 2020-02-23 | Discharge: 2020-02-23 | Disposition: A | Discharge status: Home / Self Care | Payer: Medicare HMO | Attending: Surgery | Admitting: Surgery

## 2020-02-23 DIAGNOSIS — K4090 Unilateral inguinal hernia, without obstruction or gangrene, not specified as recurrent: Secondary | ICD-10-CM

## 2020-02-23 DIAGNOSIS — Z87891 Personal history of nicotine dependence: Secondary | ICD-10-CM | POA: Diagnosis not present

## 2020-02-23 DIAGNOSIS — K4020 Bilateral inguinal hernia, without obstruction or gangrene, not specified as recurrent: Secondary | ICD-10-CM | POA: Insufficient documentation

## 2020-02-23 DIAGNOSIS — Z888 Allergy status to other drugs, medicaments and biological substances status: Secondary | ICD-10-CM | POA: Insufficient documentation

## 2020-02-23 HISTORY — PX: XI ROBOTIC ASSISTED INGUINAL HERNIA REPAIR WITH MESH: SHX6706

## 2020-02-23 LAB — GLUCOSE, CAPILLARY
Glucose-Capillary: 159 mg/dL — ABNORMAL HIGH (ref 70–99)
Glucose-Capillary: 254 mg/dL — ABNORMAL HIGH (ref 70–99)

## 2020-02-23 SURGERY — REPAIR, HERNIA, INGUINAL, ROBOT-ASSISTED, LAPAROSCOPIC, USING MESH
Anesthesia: General | Site: Groin | Laterality: Right

## 2020-02-23 MED ORDER — CEFAZOLIN SODIUM-DEXTROSE 2-4 GM/100ML-% IV SOLN
2.0000 g | INTRAVENOUS | Status: AC
  Administered 2020-02-23: 2 g via INTRAVENOUS

## 2020-02-23 MED ORDER — ACETAMINOPHEN 325 MG PO TABS: 650.0000 mg | ORAL_TABLET | Freq: Three times a day (TID) | ORAL | 0 refills | Status: AC | PRN

## 2020-02-23 MED ORDER — LIDOCAINE HCL (PF) 2 % IJ SOLN
INTRAMUSCULAR | Status: AC
  Filled 2020-02-23: qty 5

## 2020-02-23 MED ORDER — PHENYLEPHRINE HCL (PRESSORS) 10 MG/ML IV SOLN
INTRAVENOUS | Status: DC | PRN
  Administered 2020-02-23 (×3): 100 ug via INTRAVENOUS

## 2020-02-23 MED ORDER — CELECOXIB 200 MG PO CAPS
ORAL_CAPSULE | ORAL | Status: AC
  Administered 2020-02-23: 200 mg via ORAL
  Filled 2020-02-23: qty 1

## 2020-02-23 MED ORDER — SODIUM CHLORIDE 0.9 % IV SOLN: INTRAVENOUS | Status: DC

## 2020-02-23 MED ORDER — DEXAMETHASONE SODIUM PHOSPHATE 10 MG/ML IJ SOLN
INTRAMUSCULAR | Status: DC | PRN
  Administered 2020-02-23: 5 mg via INTRAVENOUS

## 2020-02-23 MED ORDER — BUPIVACAINE-EPINEPHRINE 0.5% -1:200000 IJ SOLN
INTRAMUSCULAR | Status: DC | PRN
  Administered 2020-02-23: 10 mL

## 2020-02-23 MED ORDER — PROPOFOL 10 MG/ML IV BOLUS
INTRAVENOUS | Status: DC | PRN
  Administered 2020-02-23: 10 mg via INTRAVENOUS
  Administered 2020-02-23: 100 mg via INTRAVENOUS

## 2020-02-23 MED ORDER — GLYCOPYRROLATE 0.2 MG/ML IJ SOLN
INTRAMUSCULAR | Status: AC
  Filled 2020-02-23: qty 1

## 2020-02-23 MED ORDER — ONDANSETRON HCL 4 MG/2ML IJ SOLN
INTRAMUSCULAR | Status: DC | PRN
  Administered 2020-02-23: 4 mg via INTRAVENOUS

## 2020-02-23 MED ORDER — GABAPENTIN 300 MG PO CAPS
ORAL_CAPSULE | ORAL | Status: AC
  Administered 2020-02-23: 300 mg via ORAL
  Filled 2020-02-23: qty 1

## 2020-02-23 MED ORDER — EPHEDRINE 5 MG/ML INJ
INTRAVENOUS | Status: AC
  Filled 2020-02-23: qty 10

## 2020-02-23 MED ORDER — CHLORHEXIDINE GLUCONATE CLOTH 2 % EX PADS
6.0000 | MEDICATED_PAD | Freq: Once | CUTANEOUS | Status: AC
  Administered 2020-02-23: 6 via TOPICAL

## 2020-02-23 MED ORDER — BUPIVACAINE LIPOSOME 1.3 % IJ SUSP
INTRAMUSCULAR | Status: DC | PRN
  Administered 2020-02-23: 20 mL

## 2020-02-23 MED ORDER — IBUPROFEN 400 MG PO TABS: 800.0000 mg | ORAL_TABLET | Freq: Three times a day (TID) | ORAL | 0 refills | Status: AC | PRN

## 2020-02-23 MED ORDER — BUPIVACAINE-EPINEPHRINE (PF) 0.25% -1:200000 IJ SOLN
INTRAMUSCULAR | Status: AC
  Filled 2020-02-23: qty 30

## 2020-02-23 MED ORDER — CHLORHEXIDINE GLUCONATE 0.12 % MT SOLN: 15.0000 mL | Freq: Once | OROMUCOSAL | Status: AC

## 2020-02-23 MED ORDER — EPHEDRINE SULFATE 50 MG/ML IJ SOLN
INTRAMUSCULAR | Status: DC | PRN
  Administered 2020-02-23: 10 mg via INTRAVENOUS
  Administered 2020-02-23 (×2): 5 mg via INTRAVENOUS
  Administered 2020-02-23: 10 mg via INTRAVENOUS
  Administered 2020-02-23: 5 mg via INTRAVENOUS

## 2020-02-23 MED ORDER — ONDANSETRON HCL 4 MG/2ML IJ SOLN
INTRAMUSCULAR | Status: AC
  Filled 2020-02-23: qty 2

## 2020-02-23 MED ORDER — ACETAMINOPHEN 500 MG PO TABS
ORAL_TABLET | ORAL | Status: AC
  Administered 2020-02-23: 1000 mg via ORAL
  Filled 2020-02-23: qty 2

## 2020-02-23 MED ORDER — CEFAZOLIN SODIUM-DEXTROSE 2-4 GM/100ML-% IV SOLN
INTRAVENOUS | Status: AC
  Filled 2020-02-23: qty 100

## 2020-02-23 MED ORDER — HYDROCODONE-ACETAMINOPHEN 5-325 MG PO TABS: 1.0000 | ORAL_TABLET | Freq: Four times a day (QID) | ORAL | 0 refills | Status: AC | PRN

## 2020-02-23 MED ORDER — CELECOXIB 200 MG PO CAPS: 200.0000 mg | ORAL_CAPSULE | ORAL | Status: AC

## 2020-02-23 MED ORDER — ACETAMINOPHEN 500 MG PO TABS: 1000.0000 mg | ORAL_TABLET | ORAL | Status: AC

## 2020-02-23 MED ORDER — PROPOFOL 10 MG/ML IV BOLUS
INTRAVENOUS | Status: AC
  Filled 2020-02-23: qty 20

## 2020-02-23 MED ORDER — ORAL CARE MOUTH RINSE: 15.0000 mL | Freq: Once | OROMUCOSAL | Status: AC

## 2020-02-23 MED ORDER — BUPIVACAINE LIPOSOME 1.3 % IJ SUSP
INTRAMUSCULAR | Status: AC
  Filled 2020-02-23: qty 20

## 2020-02-23 MED ORDER — GABAPENTIN 300 MG PO CAPS: 300.0000 mg | ORAL_CAPSULE | ORAL | Status: AC

## 2020-02-23 MED ORDER — SUGAMMADEX SODIUM 200 MG/2ML IV SOLN
INTRAVENOUS | Status: DC | PRN
  Administered 2020-02-23: 200 mg via INTRAVENOUS

## 2020-02-23 MED ORDER — LIDOCAINE HCL (CARDIAC) PF 100 MG/5ML IV SOSY
PREFILLED_SYRINGE | INTRAVENOUS | Status: DC | PRN
  Administered 2020-02-23: 100 mg via INTRAVENOUS

## 2020-02-23 MED ORDER — PHENYLEPHRINE HCL (PRESSORS) 10 MG/ML IV SOLN
INTRAVENOUS | Status: AC
  Filled 2020-02-23: qty 1

## 2020-02-23 MED ORDER — FENTANYL CITRATE (PF) 100 MCG/2ML IJ SOLN
INTRAMUSCULAR | Status: AC
  Filled 2020-02-23: qty 2

## 2020-02-23 MED ORDER — ROCURONIUM BROMIDE 10 MG/ML (PF) SYRINGE
PREFILLED_SYRINGE | INTRAVENOUS | Status: AC
  Filled 2020-02-23: qty 10

## 2020-02-23 MED ORDER — CHLORHEXIDINE GLUCONATE 0.12 % MT SOLN
OROMUCOSAL | Status: AC
  Administered 2020-02-23: 15 mL via OROMUCOSAL
  Filled 2020-02-23: qty 15

## 2020-02-23 MED ORDER — FENTANYL CITRATE (PF) 100 MCG/2ML IJ SOLN
INTRAMUSCULAR | Status: DC | PRN
  Administered 2020-02-23: 100 ug via INTRAVENOUS

## 2020-02-23 MED ORDER — DOCUSATE SODIUM 100 MG PO CAPS: 100.0000 mg | ORAL_CAPSULE | Freq: Two times a day (BID) | ORAL | 0 refills | Status: AC | PRN

## 2020-02-23 MED ORDER — ROCURONIUM BROMIDE 100 MG/10ML IV SOLN
INTRAVENOUS | Status: DC | PRN
  Administered 2020-02-23: 70 mg via INTRAVENOUS

## 2020-02-23 MED ORDER — DEXAMETHASONE SODIUM PHOSPHATE 10 MG/ML IJ SOLN
INTRAMUSCULAR | Status: AC
  Filled 2020-02-23: qty 1

## 2020-02-23 SURGICAL SUPPLY — 48 items
ADH SKN CLS APL DERMABOND .7 (GAUZE/BANDAGES/DRESSINGS) ×1
APL PRP STRL LF DISP 70% ISPRP (MISCELLANEOUS) ×1
BAG INFUSER PRESSURE 100CC (MISCELLANEOUS) IMPLANT
BLADE SURG SZ11 CARB STEEL (BLADE) ×2 IMPLANT
BNDG GAUZE 4.5X4.1 6PLY STRL (MISCELLANEOUS) ×2 IMPLANT
CANISTER SUCT 1200ML W/VALVE (MISCELLANEOUS) ×2 IMPLANT
CHLORAPREP W/TINT 26 (MISCELLANEOUS) ×2 IMPLANT
COVER TIP SHEARS 8 DVNC (MISCELLANEOUS) ×1 IMPLANT
COVER TIP SHEARS 8MM DA VINCI (MISCELLANEOUS) ×1
COVER WAND RF STERILE (DRAPES) IMPLANT
DEFOGGER SCOPE WARMER CLEARIFY (MISCELLANEOUS) ×2 IMPLANT
DERMABOND ADVANCED (GAUZE/BANDAGES/DRESSINGS) ×1
DERMABOND ADVANCED .7 DNX12 (GAUZE/BANDAGES/DRESSINGS) ×1 IMPLANT
DRAPE ARM DVNC X/XI (DISPOSABLE) ×3 IMPLANT
DRAPE COLUMN DVNC XI (DISPOSABLE) ×1 IMPLANT
DRAPE DA VINCI XI ARM (DISPOSABLE) ×3
DRAPE DA VINCI XI COLUMN (DISPOSABLE) ×1
ELECT CAUTERY BLADE 6.4 (BLADE) IMPLANT
ELECT REM PT RETURN 9FT ADLT (ELECTROSURGICAL) ×2
ELECTRODE REM PT RTRN 9FT ADLT (ELECTROSURGICAL) ×1 IMPLANT
GLOVE SURG SYN 6.5 ES PF (GLOVE) ×6 IMPLANT
GLOVE SURG UNDER POLY LF SZ7 (GLOVE) ×4 IMPLANT
GOWN STRL REUS W/ TWL LRG LVL3 (GOWN DISPOSABLE) ×3 IMPLANT
GOWN STRL REUS W/TWL LRG LVL3 (GOWN DISPOSABLE) ×6
IRRIGATOR SUCT 8 DISP DVNC XI (IRRIGATION / IRRIGATOR) IMPLANT
IRRIGATOR SUCTION 8MM XI DISP (IRRIGATION / IRRIGATOR)
IV NS 1000ML (IV SOLUTION)
IV NS 1000ML BAXH (IV SOLUTION) IMPLANT
LABEL OR SOLS (LABEL) IMPLANT
MANIFOLD NEPTUNE II (INSTRUMENTS) ×2 IMPLANT
MESH 3DMAX 4X6 RT LRG (Mesh General) ×1 IMPLANT
MESH 3DMAX MID 4X6 RT LRG (Mesh General) ×1 IMPLANT
NEEDLE HYPO 22GX1.5 SAFETY (NEEDLE) ×2 IMPLANT
NEEDLE INSUFFLATION 14GA 120MM (NEEDLE) ×2 IMPLANT
OBTURATOR OPTICAL STANDARD 8MM (TROCAR) ×1
OBTURATOR OPTICAL STND 8 DVNC (TROCAR) ×1
OBTURATOR OPTICALSTD 8 DVNC (TROCAR) ×1 IMPLANT
PACK LAP CHOLECYSTECTOMY (MISCELLANEOUS) ×2 IMPLANT
PENCIL ELECTRO HAND CTR (MISCELLANEOUS) ×2 IMPLANT
SEAL CANN UNIV 5-8 DVNC XI (MISCELLANEOUS) ×3 IMPLANT
SEAL XI 5MM-8MM UNIVERSAL (MISCELLANEOUS) ×3
SET TUBE SMOKE EVAC HIGH FLOW (TUBING) ×2 IMPLANT
SOLUTION ELECTROLUBE (MISCELLANEOUS) ×2 IMPLANT
SUT MNCRL AB 4-0 PS2 18 (SUTURE) ×2 IMPLANT
SUT VIC AB 2-0 SH 27 (SUTURE) ×2
SUT VIC AB 2-0 SH 27XBRD (SUTURE) ×1 IMPLANT
SUT VLOC 90 6 CV-15 VIOLET (SUTURE) ×4 IMPLANT
SYR 30ML LL (SYRINGE) ×2 IMPLANT

## 2020-02-23 NOTE — Discharge Instructions (Signed)
Hernia repair, Care After This sheet gives you information about how to care for yourself after your procedure. Your health care provider may also give you more specific instructions. If you have problems or questions, contact your health care provider. What can I expect after the procedure? After your procedure, it is common to have the following:  Pain in your abdomen, especially in the incision areas. You will be given medicine to control the pain.  Tiredness. This is a normal part of the recovery process. Your energy level will return to normal over the next several weeks.  Changes in your bowel movements, such as constipation or needing to go more often. Talk with your health care provider about how to manage this. Follow these instructions at home: Medicines   tylenol and advil as needed for discomfort.  Please alternate between the two every four hours as needed for pain.     Use narcotics, if prescribed, only when tylenol and motrin is not enough to control pain.   325-650mg  every 8hrs to max of 3000mg /24hrs (including the 325mg  in every norco dose) for the tylenol.     Advil up to 400mg  per dose every 8hrs as needed for pain.    PLEASE RECORD NUMBER OF PILLS TAKEN UNTIL NEXT FOLLOW UP APPT.  THIS WILL HELP DETERMINE HOW READY YOU ARE TO BE RELEASED FROM ANY ACTIVITY RESTRICTIONS  Do not drive or use heavy machinery while taking prescription pain medicine.  Do not drink alcohol while taking prescription pain medicine.  Incision care     Follow instructions from your health care provider about how to take care of your incision areas. Make sure you: ? Keep your incisions clean and dry. ? Wash your hands with soap and water before and after applying medicine to the areas, and before and after changing your bandage (dressing). If soap and water are not available, use hand sanitizer. ? Change your dressing as told by your health care provider. ? Leave stitches (sutures), skin  glue, or adhesive strips in place. These skin closures may need to stay in place for 2 weeks or longer. If adhesive strip edges start to loosen and curl up, you may trim the loose edges. Do not remove adhesive strips completely unless your health care provider tells you to do that.  Do not wear tight clothing over the incisions. Tight clothing may rub and irritate the incision areas, which may cause the incisions to open.  Do not take baths, swim, or use a hot tub until your health care provider approves. OK TO SHOWER IN 24HRS.    Check your incision area every day for signs of infection. Check for: ? More redness, swelling, or pain. ? More fluid or blood. ? Warmth. ? Pus or a bad smell. Activity  Avoid lifting anything that is heavier than 10 lb (4.5 kg) for 2 weeks or until your health care provider says it is okay.  No pushing/pulling greater than 30lbs  You may resume normal activities as told by your health care provider. Ask your health care provider what activities are safe for you.  Take rest breaks during the day as needed. Eating and drinking  Follow instructions from your health care provider about what you can eat after surgery.  To prevent or treat constipation while you are taking prescription pain medicine, your health care provider may recommend that you: ? Drink enough fluid to keep your urine clear or pale yellow. ? Take over-the-counter or prescription medicines. ?  Eat foods that are high in fiber, such as fresh fruits and vegetables, whole grains, and beans. ? Limit foods that are high in fat and processed sugars, such as fried and sweet foods. General instructions  Ask your health care provider when you will need an appointment to get your sutures or staples removed.  Keep all follow-up visits as told by your health care provider. This is important. Contact a health care provider if:  You have more redness, swelling, or pain around your incisions.  You have  more fluid or blood coming from the incisions.  Your incisions feel warm to the touch.  You have pus or a bad smell coming from your incisions or your dressing.  You have a fever.  You have an incision that breaks open (edges not staying together) after sutures or staples have been removed. Get help right away if:  You develop a rash.  You have chest pain or difficulty breathing.  You have pain or swelling in your legs.  You feel light-headed or you faint.  Your abdomen swells (becomes distended).  You have nausea or vomiting.  You have blood in your stool (feces). This information is not intended to replace advice given to you by your health care provider. Make sure you discuss any questions you have with your health care provider. Document Released: 08/22/2004 Document Revised: 10/22/2017 Document Reviewed: 11/04/2015 Elsevier Interactive Patient Education  2019 Elsevier Inc[employer]   AMBULATORY SURGERY  DISCHARGE INSTRUCTIONS   1) The drugs that you were given will stay in your system until tomorrow so for the next 24 hours you should not:  A) Drive an automobile B) Make any legal decisions C) Drink any alcoholic beverage   2) You may resume regular meals tomorrow.  Today it is better to start with liquids and gradually work up to solid foods.  You may eat anything you prefer, but it is better to start with liquids, then soup and crackers, and gradually work up to solid foods.   3) Please notify your doctor immediately if you have any unusual bleeding, trouble breathing, redness and pain at the surgery site, drainage, fever, or pain not relieved by medication.    4) Additional Instructions:        Please contact your physician with any problems or Same Day Surgery at [phone], Monday through Friday 6 am to 4 pm, or [place] [facility] number at [phone].

## 2020-02-23 NOTE — Anesthesia Postprocedure Evaluation (Signed)
Anesthesia Post Note  Patient: CORIAN HANDLEY  Procedure(s) Performed: XI ROBOTIC ASSISTED INGUINAL HERNIA REPAIR WITH MESH (Right Groin)  Patient location during evaluation: PACU Anesthesia Type: General Level of consciousness: awake and alert Pain management: pain level controlled Vital Signs Assessment: post-procedure vital signs reviewed and stable Respiratory status: spontaneous breathing, nonlabored ventilation, respiratory function stable and patient connected to nasal cannula oxygen Cardiovascular status: blood pressure returned to baseline and stable Postop Assessment: no apparent nausea or vomiting Anesthetic complications: no   No complications documented.   Last Vitals:  Vitals:   02/23/20 1405 02/23/20 1420  BP: 114/64 118/65  Pulse: 80 81  Resp: (!) 24 18  Temp:  (!) 36.4 C  SpO2: 92% 94%    Last Pain:  Vitals:   02/23/20 1420  TempSrc:   PainSc: 0-No pain                 Corinda Gubler

## 2020-02-23 NOTE — Progress Notes (Signed)
Pt CBG: 254. Dr. Suzan Slick notified. Acknowledged. No new orders received.

## 2020-02-23 NOTE — Interval H&P Note (Signed)
History and Physical Interval Note:  02/23/2020 10:10 AM  Terry Macias  has presented today for surgery, with the diagnosis of K40.20 bilateral inguinal hernia.  The various methods of treatment have been discussed with the patient and family. After consideration of risks, benefits and other options for treatment, the patient has consented to  Procedure(s): XI ROBOTIC ASSISTED INGUINAL HERNIA REPAIR WITH MESH (Bilateral) as a surgical intervention.  The patient's history has been reviewed, patient examined, no change in status, stable for surgery.  I have reviewed the patient's chart and labs.  Questions were answered to the patient's satisfaction.     Anvi Mangal Tonna Boehringer

## 2020-02-23 NOTE — Anesthesia Preprocedure Evaluation (Addendum)
Anesthesia Evaluation  Patient identified by MRN, date of birth, ID band Patient awake    Reviewed: Allergy & Precautions, NPO status , Patient's Chart, lab work & pertinent test results  History of Anesthesia Complications Negative for: history of anesthetic complications  Airway Mallampati: III  TM Distance: >3 FB Neck ROM: Full    Dental no notable dental hx. (+) Teeth Intact   Pulmonary neg pulmonary ROS, neg sleep apnea, neg COPD, Patient abstained from smoking.Not current smoker,    Pulmonary exam normal breath sounds clear to auscultation       Cardiovascular Exercise Tolerance: Good METShypertension, (-) CAD and (-) Past MI (-) dysrhythmias  Rhythm:Regular Rate:Normal - Systolic murmurs RBBB   Neuro/Psych negative neurological ROS  negative psych ROS   GI/Hepatic GERD  Medicated and Controlled,(+)     (-) substance abuse  ,   Endo/Other  diabetes, Type 2, Insulin Dependent  Renal/GU negative Renal ROS     Musculoskeletal   Abdominal   Peds  Hematology   Anesthesia Other Findings Past Medical History: No date: Diabetes mellitus without complication (HCC) No date: GERD (gastroesophageal reflux disease) No date: Hypertension  Reproductive/Obstetrics                            Anesthesia Physical Anesthesia Plan  ASA: II  Anesthesia Plan: General   Post-op Pain Management:    Induction: Intravenous  PONV Risk Score and Plan: 3 and Ondansetron, Dexamethasone and Treatment may vary due to age or medical condition  Airway Management Planned: Oral ETT  Additional Equipment: None  Intra-op Plan:   Post-operative Plan: Extubation in OR  Informed Consent: I have reviewed the patients History and Physical, chart, labs and discussed the procedure including the risks, benefits and alternatives for the proposed anesthesia with the patient or authorized representative who has  indicated his/her understanding and acceptance.     Dental advisory given  Plan Discussed with: CRNA and Surgeon  Anesthesia Plan Comments: (Discussed risks of anesthesia with patient, including PONV, sore throat, lip/dental damage. Rare risks discussed as well, such as cardiorespiratory and neurological sequelae. Patient understands.)        Anesthesia Quick Evaluation

## 2020-02-23 NOTE — Transfer of Care (Signed)
Immediate Anesthesia Transfer of Care Note  Patient: Terry Macias  Procedure(s) Performed: XI ROBOTIC ASSISTED INGUINAL HERNIA REPAIR WITH MESH (Right Groin)  Patient Location: PACU  Anesthesia Type:General  Level of Consciousness: sedated  Airway & Oxygen Therapy: Patient Spontanous Breathing and Patient connected to face mask oxygen  Post-op Assessment: Report given to RN and Post -op Vital signs reviewed and stable  Post vital signs: Reviewed and stable  Last Vitals:  Vitals Value Taken Time  BP 95/52 02/23/20 1220  Temp    Pulse 47 02/23/20 1230  Resp 16 02/23/20 1230  SpO2 99 % 02/23/20 1230  Vitals shown include unvalidated device data.  Last Pain:  Vitals:   02/23/20 0913  TempSrc: Oral  PainSc: 0-No pain         Complications: No complications documented.

## 2020-02-23 NOTE — Anesthesia Procedure Notes (Signed)
Procedure Name: Intubation Date/Time: 02/23/2020 10:27 AM Performed by: Ginger Carne, CRNA Pre-anesthesia Checklist: Patient identified, Emergency Drugs available, Suction available, Patient being monitored and Timeout performed Patient Re-evaluated:Patient Re-evaluated prior to induction Oxygen Delivery Method: Circle system utilized Preoxygenation: Pre-oxygenation with 100% oxygen Induction Type: IV induction Ventilation: Mask ventilation without difficulty Laryngoscope Size: McGraph and 3 Grade View: Grade I Tube type: Oral Tube size: 7.0 mm Number of attempts: 1 Airway Equipment and Method: Stylet,  Video-laryngoscopy and Oral airway Placement Confirmation: ETT inserted through vocal cords under direct vision,  positive ETCO2 and breath sounds checked- equal and bilateral Secured at: 22 cm Tube secured with: Tape Dental Injury: Teeth and Oropharynx as per pre-operative assessment

## 2020-02-26 ENCOUNTER — Encounter: Payer: Self-pay | Admitting: Surgery

## 2020-02-26 NOTE — Op Note (Signed)
Preoperative diagnosis: bilateral inguinal Hernia.  Postoperative diagnosis: right inguinal Hernia, indirect  Procedure: Robotic assisted laparoscopic right inguinal hernia repair with mesh  Anesthesia: General  Surgeon: Dr. Tonna Boehringer  Wound Classification: Clean  Specimen: none  Complications: None  Estimated Blood Loss: 6mL   Indications:  inguinal hernia. Repair was indicated to avoid complications of incarceration, obstruction and pain, and a prosthetic mesh repair was elected.  See H&P for further details.  Findings: 1. Vas Deferens and cord structures identified and preserved 2. Bard 3D max medium weight mesh used for repair 3. Adequate hemostasis achieved  Description of procedure: The patient was taken to the operating room. A time-out was completed verifying correct patient, procedure, site, positioning, and implant(s) and/or special equipment prior to beginning this procedure.  Area was prepped and draped in the usual sterile fashion. An incision was marked 20 cm above the pubic tubercle, slightly above the umbilicus  Scrotum wrapped in Kerlix roll.  Veress needle inserted at palmer's point.  Saline drop test noted to be positive with gradual increase in pressure after initiation of gas insufflation.  15 mm of pressure was achieved prior to removing the Veress needle and then placing a 8 mm port via the Optiview technique through the supraumbilical site.  Inspection of the area afterwards noted no injury to the surrounding organs during insertion of the needle and the port.  2 port sites were marked 8 cm to the lateral sides of the initial port, and a 8 mm robotic port was placed on the left side, another 8 mm robotic port on the right side under direct supervision.  Local anesthesia  infused to the preplanned incision sites prior to insertion of the port.  The [employer] platform was then brought into the operative field and docked to the ports.  Examination of the abdominal  cavity noted a right indirect inguinal hernia, containing loops of small bowel.  The bowel was reduced with gentle traction.  A peritoneal flap was created approximately 8cm cephalad to the defect by using scissors with electrocautery.  Dissection was carried down towards the pubic tubercle, developing the myopectineal orifice view.  Laterally the flap was carried towards the ASIS.  Large hernia sac was noted, which carefully dissected away from the adjacent tissues to be fully reduced out of hernia cavity.  Any bleeding was controlled with combination of electrocautery and manual pressure.    After confirming adequate dissection and the peritoneal reflection completely down and away from the cord structures, a Large Bard 3DMax medium weight mesh was placed within the anterior abdominal wall, secured in place using 2-0 Vicryl on an SH needle immediately above the pubic tubercle.  After noting proper placement of the mesh with the peritoneal reflection deep to it, the previously created peritoneal flap was secured back up to the anterior abdominal wall using running 3-0 V-Lock.  Both needles were then removed out of the abdominal cavity.  Inspection of left side did not show any hernias. Xi platform undocked from the ports and removed off of operative field.  exparel infused as ilioinguinal block.  Abdomen then desufflated and ports removed. All the skin incisions were then closed with a subcuticular stitch of Monocryl 4-0. Dermabond was applied. The testis was gently pulled down into its anatomic position in the scrotum.  The patient tolerated the procedure well and was taken to the postanesthesia care unit in stable condition. Sponge and instrument count correct at end of procedure.

## 2020-12-13 DIAGNOSIS — Z23 Encounter for immunization: Secondary | ICD-10-CM | POA: Diagnosis not present

## 2021-07-31 IMAGING — CT CT ABD-PELV W/O CM
2 of 4 series · 15 of 46 positions shown, 17 images · non-contrast
Comparison: Right hip CT 09/07/2016.

CLINICAL DATA: 89-year-old male with palpable abnormality of the
right lower quadrant discovered on [REDACTED]. Finding increases
when standing.

EXAM:
CT ABDOMEN AND PELVIS WITHOUT CONTRAST
TECHNIQUE: Multidetector CT imaging of the abdomen and pelvis was performed
following the standard protocol without IV contrast.

[Series 2: axials routine abdomen pelvis without 5.00 · axial · non-contrast · 0.67mm/px · z∈[-1508,-1083]mm · 12 of 97 slices shown, 14 images]
[im 8/97  soft-tissue]
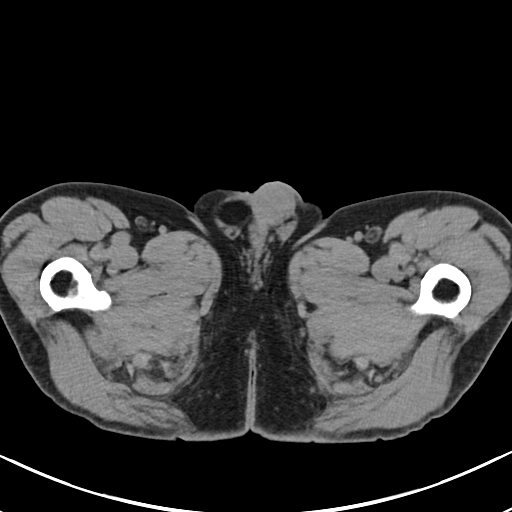
[im 8/97  bone]
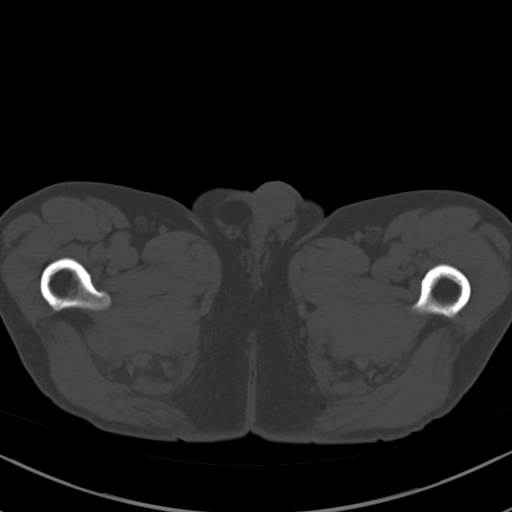
[im 16/97  soft-tissue]
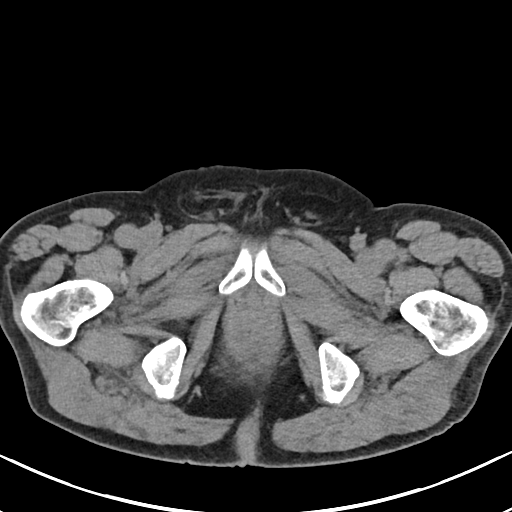
[im 24/97  soft-tissue]
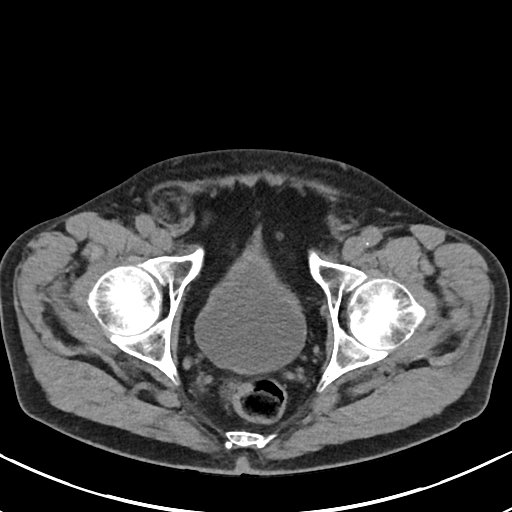
[im 31/97  soft-tissue]
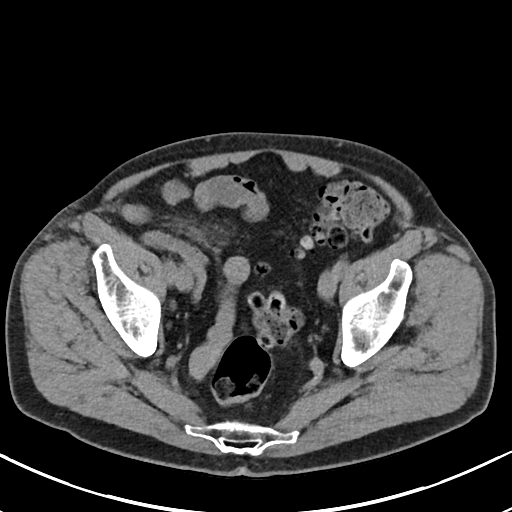
[im 39/97  soft-tissue]
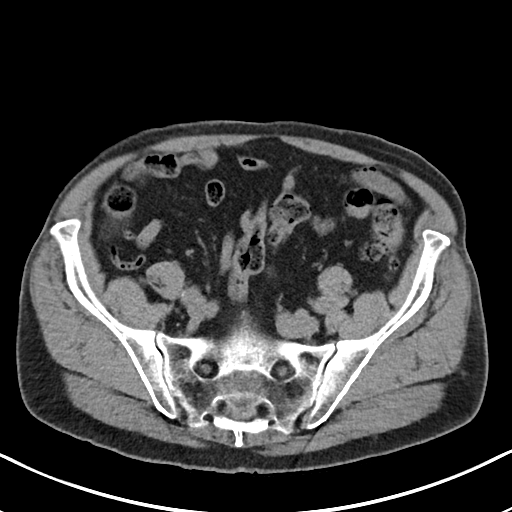
[im 47/97  soft-tissue]
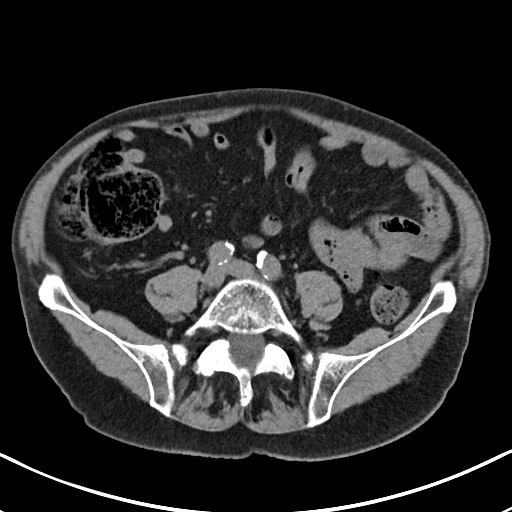
[im 54/97  soft-tissue]
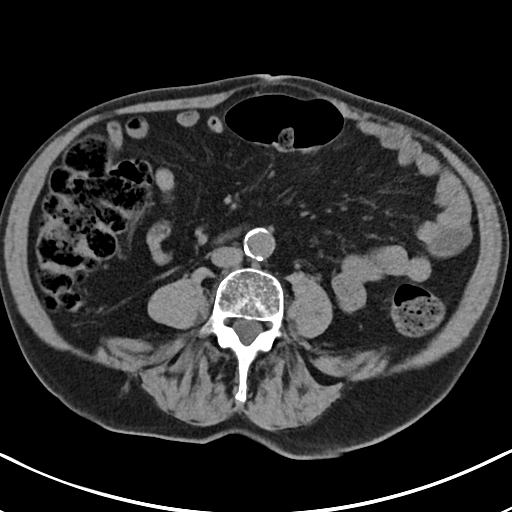
[im 62/97  soft-tissue]
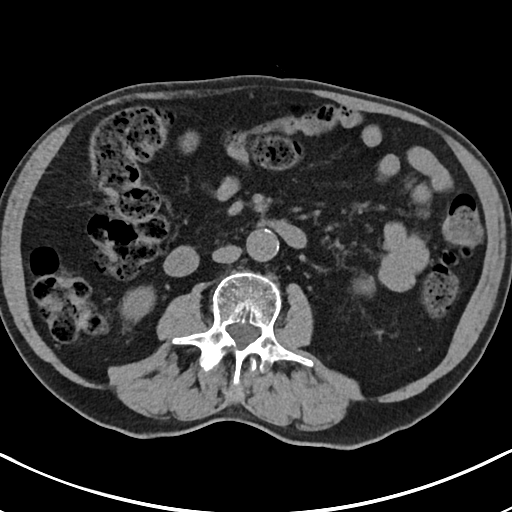
[im 70/97  soft-tissue]
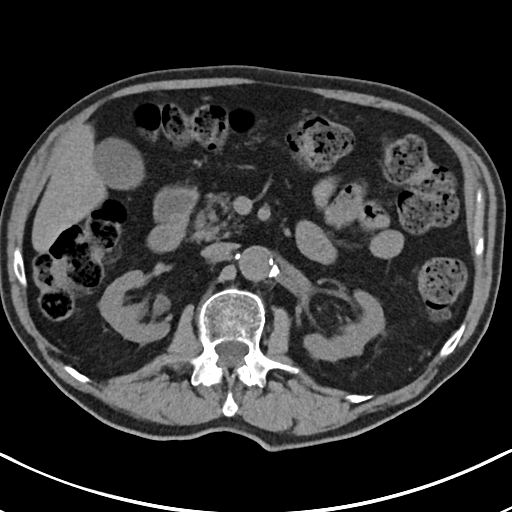
[im 70/97  bone]
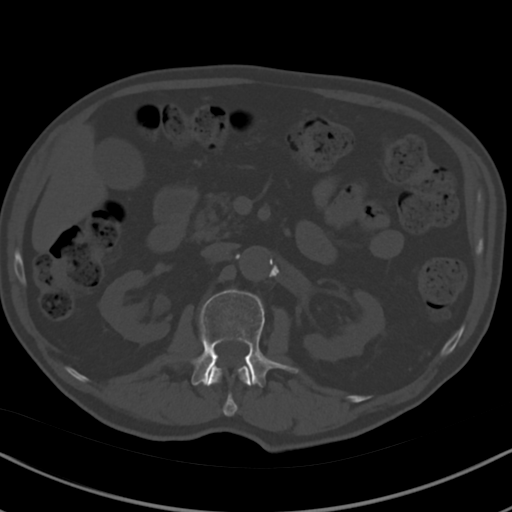
[im 77/97  soft-tissue]
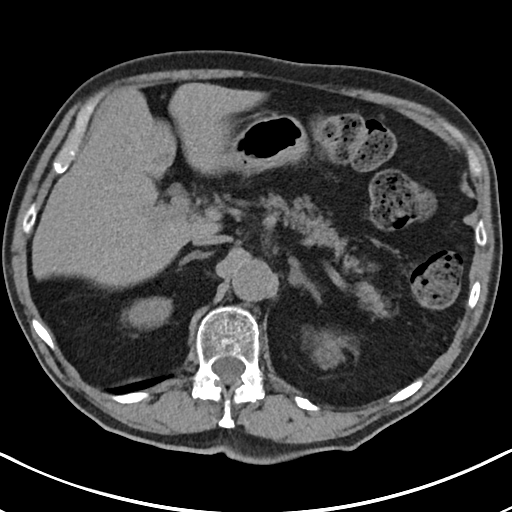
[im 85/97  soft-tissue]
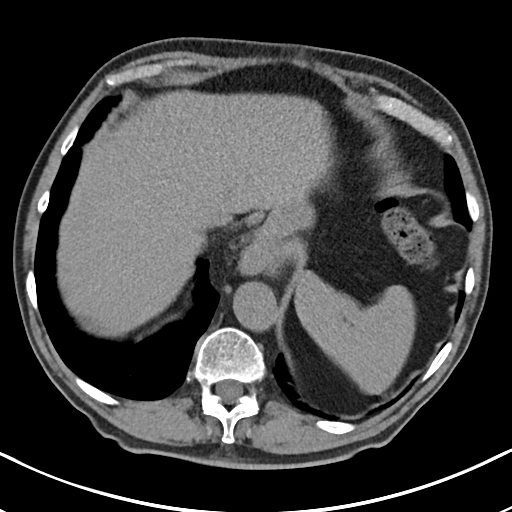
[im 93/97  soft-tissue]
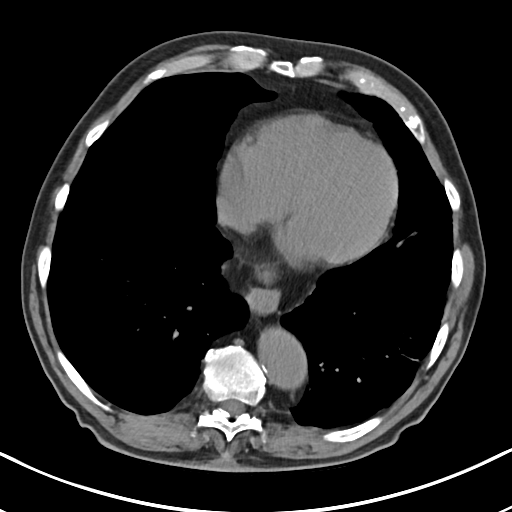

[Series 4: coronals routine abdomen pelvis without 2.00 cor · coronal · non-contrast · 0.67mm/px · 3 of 151 slices shown]
[im 51/151  soft-tissue]
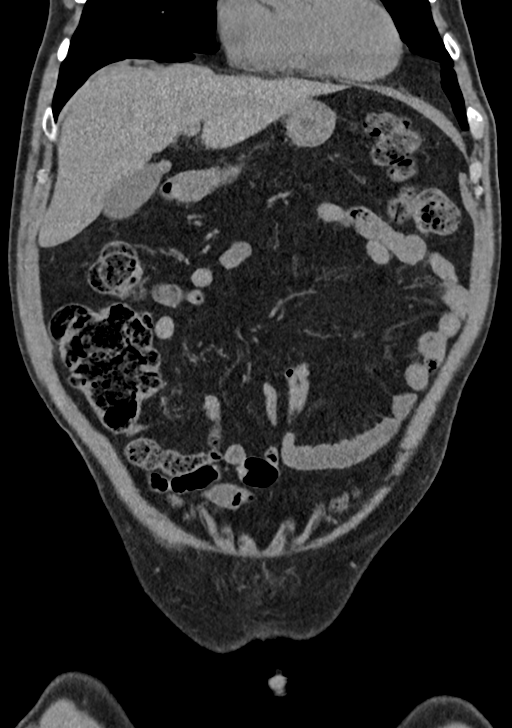
[im 67/151  soft-tissue]
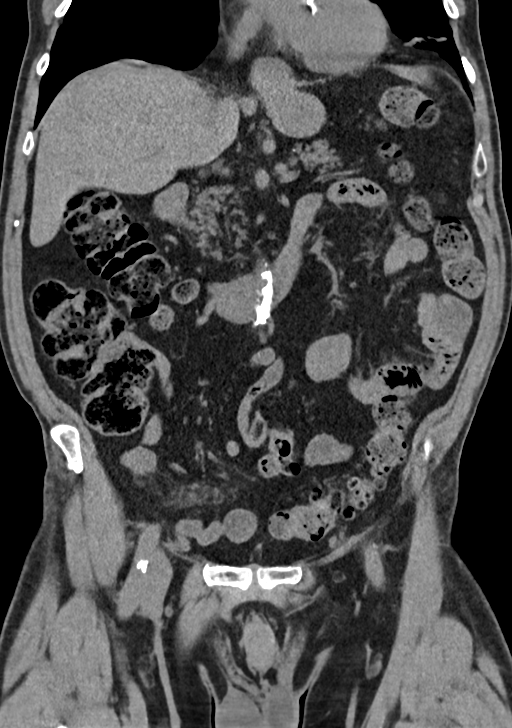
[im 84/151  soft-tissue]
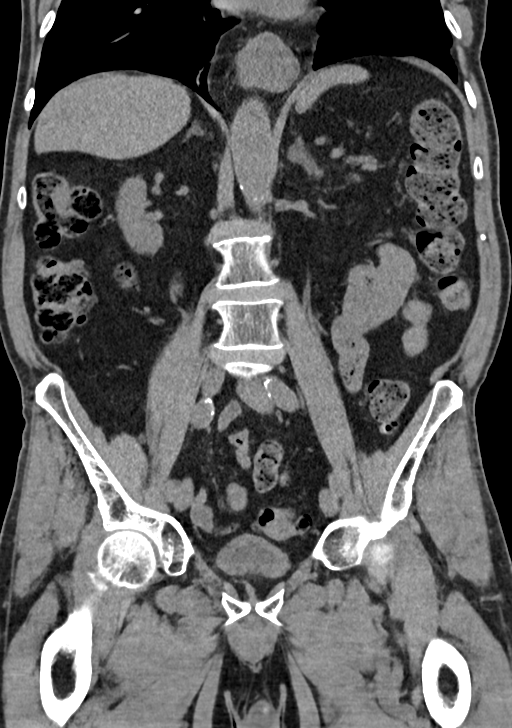

[15 of 46 positions shown; findings below may reference images not displayed]

FINDINGS: Lower chest: Pulmonary hyperinflation. Mild scarring in the lingula
and bilateral posterior basal segments of the lower lobes. No
cardiomegaly or pericardial effusion. No pleural effusion. Moderate
gastric hiatal hernia.

Hepatobiliary: Negative noncontrast liver and gallbladder.

Pancreas: Partial fatty atrophy, otherwise negative.

Spleen: Negative.

Adrenals/Urinary Tract: Normal adrenal glands.

Negative for age noncontrast kidneys. No hydronephrosis or
hydroureter. Diminutive and unremarkable urinary bladder.

Stomach/Bowel: Redundant sigmoid colon with extensive diverticulosis
from the proximal sigmoid through the mid descending colon.
Redundant transverse colon and abundant retained stool throughout
the large bowel. Mild to moderate diverticulosis of the right colon.
Normal appendix. No large bowel inflammation. Negative terminal
ileum.

Right lower quadrant distal small bowel in proximity to right
inguinal hernia further detailed below.

No dilated small bowel. Gastric hiatal hernia with negative
intra-abdominal stomach. Negative duodenum. No free air, free fluid.

Vascular/Lymphatic: Aortoiliac calcified atherosclerosis. Tortuous
aorta without discrete aneurysm. Vascular patency is not evaluated
in the absence of IV contrast. No lymphadenopathy.

Reproductive: Fat and mesentery containing right inguinal hernia,
marked as the area of clinical concern on series 2, image 83. This
was present but less conspicuous in 0545, and now there is marginal
involvement of distal small bowel loops in the right lower quadrant
although no overt herniated bowel (series 2, image 72). Overall
hernia length is approximately 7 cm. No inflammatory changes of the
hernia.

Possible small contralateral fat containing left inguinal hernia
also (also on image 83).

Other: No pelvic free fluid.

Musculoskeletal: Chronic right pubic rami fractures. Other osseous
structures appear negative for age. No acute osseous abnormality
identified.
IMPRESSION: 1. Right inguinal hernia containing distal small bowel mesentery and
fat corresponds to the marked area of clinical concern. This is
chronic but increased since 0545, now up to about 7 cm in length. No
herniation of bowel; there is now marginal involvement of distal
small bowel loops which remain normal.
Possible small contralateral fat containing left inguinal hernia
also.

2. No superimposed acute or inflammatory process identified in the
noncontrast abdomen or pelvis.
Redundant large bowel extensive diverticulosis and abundant retained
stool. Moderate gastric hiatal hernia. Aortic Atherosclerosis
([id number]).

## 2021-11-17 ENCOUNTER — Ambulatory Visit (LOCAL_COMMUNITY_HEALTH_CENTER): Payer: Medicare HMO

## 2021-11-17 DIAGNOSIS — Z23 Encounter for immunization: Secondary | ICD-10-CM | POA: Diagnosis not present

## 2021-11-17 DIAGNOSIS — Z719 Counseling, unspecified: Secondary | ICD-10-CM

## 2021-11-17 NOTE — Progress Notes (Signed)
  Are you feeling sick today? No   Have you ever received a dose of COVID-19 Vaccine? [employer], [place], [place], [place], Other) Yes  If yes, which vaccine and how many doses?   5   Did you bring the vaccination record card or other documentation?  Yes   Do you have a health condition or are undergoing treatment that makes you moderately or severely immunocompromised? This would include, but not be limited to: cancer, HIV, organ transplant, immunosuppressive therapy/high-dose corticosteroids, or moderate/severe primary immunodeficiency.  No  Have you received COVID-19 vaccine before or during hematopoietic cell transplant (HCT) or CAR-T-cell therapies? No  Have you ever had an allergic reaction to: (This would include a severe allergic reaction or a reaction that caused hives, swelling, or respiratory distress, including wheezing.) A component of a COVID-19 vaccine or a previous dose of COVID-19 vaccine? No   Have you ever had an allergic reaction to another vaccine (other thanCOVID-19 vaccine) or an injectable medication? (This would include a severe allergic reaction or a reaction that caused hives, swelling, or respiratory distress, including wheezing.)   No    Do you have a history of any of the following:  Myocarditis or Pericarditis No  Dermal fillers:  No  Multisystem Inflammatory Syndrome (MIS-C or MIS-A)? No  COVID-19 disease within the past 3 months? No  Vaccinated with monkeypox vaccine in the last 4 weeks? No  COVID/Comirnaty +12Y given IM in right deltoid.  Flu HD given IM in left deltoid. Tolerated well.  VIS provided. COVID card updated and [facility] updated and provided to patient.

## 2022-06-29 ENCOUNTER — Encounter: Payer: Self-pay | Admitting: Ophthalmology

## 2022-06-29 NOTE — Anesthesia Preprocedure Evaluation (Addendum)
Anesthesia Evaluation  Patient identified by MRN, date of birth, ID band Patient awake    Reviewed: Allergy & Precautions, H&P , NPO status , Patient's Chart, lab work & pertinent test results  Airway Mallampati: I  TM Distance: <3 FB Neck ROM: Full    Dental no notable dental hx.    Pulmonary neg pulmonary ROS   Pulmonary exam normal breath sounds clear to auscultation       Cardiovascular hypertension, negative cardio ROS Normal cardiovascular exam Rhythm:Regular Rate:Normal     Neuro/Psych negative neurological ROS  negative psych ROS   GI/Hepatic negative GI ROS, Neg liver ROS,,,  Endo/Other  negative endocrine ROSdiabetes    Renal/GU negative Renal ROS  negative genitourinary   Musculoskeletal negative musculoskeletal ROS (+)    Abdominal   Peds negative pediatric ROS (+)  Hematology negative hematology ROS (+)   Anesthesia Other Findings Diabetes mellitus  Hypertension GERD (gastroesophageal reflux disease) CKD (chronic kidney disease), stage III (HCC)    Reproductive/Obstetrics negative OB ROS                             Anesthesia Physical Anesthesia Plan  ASA: 3  Anesthesia Plan: MAC   Post-op Pain Management:    Induction: Intravenous  PONV Risk Score and Plan:   Airway Management Planned: Natural Airway and Nasal Cannula  Additional Equipment:   Intra-op Plan:   Post-operative Plan:   Informed Consent: I have reviewed the patients History and Physical, chart, labs and discussed the procedure including the risks, benefits and alternatives for the proposed anesthesia with the patient or authorized representative who has indicated his/her understanding and acceptance.     Dental Advisory Given  Plan Discussed with: Anesthesiologist, CRNA and Surgeon  Anesthesia Plan Comments: (Patient consented for risks of anesthesia including but not limited to:  -  adverse reactions to medications - damage to eyes, teeth, lips or other oral mucosa - nerve damage due to positioning  - sore throat or hoarseness - Damage to heart, brain, nerves, lungs, other parts of body or loss of life  Patient voiced understanding.)        Anesthesia Quick Evaluation

## 2022-07-01 ENCOUNTER — Ambulatory Visit: Payer: Medicare HMO | Admitting: Anesthesiology

## 2022-07-01 ENCOUNTER — Encounter: Payer: Self-pay | Admitting: Ophthalmology

## 2022-07-01 ENCOUNTER — Encounter
Admission: RE | Disposition: A | Discharge status: Home / Self Care | Payer: Self-pay | Source: Home / Self Care | Attending: Ophthalmology

## 2022-07-01 ENCOUNTER — Ambulatory Visit
Admission: RE | Admit: 2022-07-01 | Discharge: 2022-07-01 | Disposition: A | Discharge status: Home / Self Care | Payer: Medicare HMO | Attending: Ophthalmology | Admitting: Ophthalmology

## 2022-07-01 ENCOUNTER — Other Ambulatory Visit: Payer: Self-pay

## 2022-07-01 DIAGNOSIS — E1122 Type 2 diabetes mellitus with diabetic chronic kidney disease: Secondary | ICD-10-CM | POA: Insufficient documentation

## 2022-07-01 DIAGNOSIS — Z7984 Long term (current) use of oral hypoglycemic drugs: Secondary | ICD-10-CM | POA: Insufficient documentation

## 2022-07-01 DIAGNOSIS — E1136 Type 2 diabetes mellitus with diabetic cataract: Secondary | ICD-10-CM | POA: Diagnosis not present

## 2022-07-01 DIAGNOSIS — N183 Chronic kidney disease, stage 3 unspecified: Secondary | ICD-10-CM | POA: Diagnosis not present

## 2022-07-01 DIAGNOSIS — I129 Hypertensive chronic kidney disease with stage 1 through stage 4 chronic kidney disease, or unspecified chronic kidney disease: Secondary | ICD-10-CM | POA: Insufficient documentation

## 2022-07-01 DIAGNOSIS — H2511 Age-related nuclear cataract, right eye: Secondary | ICD-10-CM | POA: Insufficient documentation

## 2022-07-01 DIAGNOSIS — Z794 Long term (current) use of insulin: Secondary | ICD-10-CM | POA: Diagnosis not present

## 2022-07-01 DIAGNOSIS — H5703 Miosis: Secondary | ICD-10-CM | POA: Diagnosis not present

## 2022-07-01 DIAGNOSIS — K219 Gastro-esophageal reflux disease without esophagitis: Secondary | ICD-10-CM | POA: Insufficient documentation

## 2022-07-01 HISTORY — PX: CATARACT EXTRACTION W/PHACO: SHX586

## 2022-07-01 HISTORY — DX: Personal history of other mental and behavioral disorders: Z86.59

## 2022-07-01 HISTORY — DX: Chronic kidney disease, stage 3 unspecified: N18.30

## 2022-07-01 LAB — GLUCOSE, CAPILLARY: Glucose-Capillary: 188 mg/dL — ABNORMAL HIGH (ref 70–99)

## 2022-07-01 SURGERY — PHACOEMULSIFICATION, CATARACT, WITH IOL INSERTION
Anesthesia: Monitor Anesthesia Care | Site: Eye | Laterality: Right

## 2022-07-01 MED ORDER — SIGHTPATH DOSE#1 BSS IO SOLN
INTRAOCULAR | Status: DC | PRN
  Administered 2022-07-01: 75 mL via OPHTHALMIC

## 2022-07-01 MED ORDER — CEFUROXIME OPHTHALMIC INJECTION 1 MG/0.1 ML
INJECTION | OPHTHALMIC | Status: DC | PRN
  Administered 2022-07-01: .1 mL via INTRACAMERAL

## 2022-07-01 MED ORDER — MIDAZOLAM HCL 2 MG/2ML IJ SOLN
INTRAMUSCULAR | Status: DC | PRN
  Administered 2022-07-01: 1 mg via INTRAVENOUS

## 2022-07-01 MED ORDER — SIGHTPATH DOSE#1 BSS IO SOLN
INTRAOCULAR | Status: DC | PRN
  Administered 2022-07-01: 15 mL via INTRAOCULAR

## 2022-07-01 MED ORDER — TETRACAINE HCL 0.5 % OP SOLN
1.0000 [drp] | OPHTHALMIC | Status: DC | PRN
  Administered 2022-07-01 (×3): 1 [drp] via OPHTHALMIC

## 2022-07-01 MED ORDER — SIGHTPATH DOSE#1 BSS IO SOLN
INTRAOCULAR | Status: DC | PRN
  Administered 2022-07-01: 2 mL

## 2022-07-01 MED ORDER — BRIMONIDINE TARTRATE-TIMOLOL 0.2-0.5 % OP SOLN
OPHTHALMIC | Status: DC | PRN
  Administered 2022-07-01: 1 [drp] via OPHTHALMIC

## 2022-07-01 MED ORDER — SIGHTPATH DOSE#1 NA HYALUR & NA CHOND-NA HYALUR IO KIT
PACK | INTRAOCULAR | Status: DC | PRN
  Administered 2022-07-01: 1 via OPHTHALMIC

## 2022-07-01 MED ORDER — ARMC OPHTHALMIC DILATING DROPS
1.0000 | OPHTHALMIC | Status: DC | PRN
  Administered 2022-07-01 (×3): 1 via OPHTHALMIC

## 2022-07-01 MED ORDER — FENTANYL CITRATE (PF) 100 MCG/2ML IJ SOLN
INTRAMUSCULAR | Status: DC | PRN
  Administered 2022-07-01: 50 ug via INTRAVENOUS

## 2022-07-01 SURGICAL SUPPLY — 19 items
CANNULA ANT/CHMB 27G (MISCELLANEOUS) IMPLANT
CANNULA ANT/CHMB 27GA (MISCELLANEOUS) IMPLANT
CATARACT SUITE SIGHTPATH (MISCELLANEOUS) ×1 IMPLANT
FEE CATARACT SUITE SIGHTPATH (MISCELLANEOUS) ×1 IMPLANT
GLOVE SRG 8 PF TXTR STRL LF DI (GLOVE) ×1 IMPLANT
GLOVE SURG ENC TEXT LTX SZ7.5 (GLOVE) ×1 IMPLANT
GLOVE SURG GAMMEX PI TX LF 7.5 (GLOVE) IMPLANT
GLOVE SURG UNDER POLY LF SZ8 (GLOVE) ×1
LENS IOL TECNIS EYHANCE 22.0 (Intraocular Lens) IMPLANT
NDL FILTER BLUNT 18X1 1/2 (NEEDLE) ×1 IMPLANT
NDL RETROBULBAR .5 NSTRL (NEEDLE) IMPLANT
NEEDLE FILTER BLUNT 18X1 1/2 (NEEDLE) ×1 IMPLANT
PACK VIT ANT 23G (MISCELLANEOUS) IMPLANT
RING MALYGIN 7.0 (MISCELLANEOUS) IMPLANT
SUT ETHILON 10-0 CS-B-6CS-B-6 (SUTURE)
SUT VICRYL  9 0 (SUTURE)
SUT VICRYL 9 0 (SUTURE) IMPLANT
SUTURE EHLN 10-0 CS-B-6CS-B-6 (SUTURE) IMPLANT
SYR 3ML LL SCALE MARK (SYRINGE) ×1 IMPLANT

## 2022-07-01 NOTE — Transfer of Care (Signed)
Immediate Anesthesia Transfer of Care Note  Patient: Terry Macias  Procedure(s) Performed: CATARACT EXTRACTION PHACO AND INTRAOCULAR LENS PLACEMENT (IOC) RIGHT DIABETIC MALYUGIN 17.86 01:24.8 (Right: Eye)  Patient Location: PACU  Anesthesia Type: MAC  Level of Consciousness: awake, alert  and patient cooperative  Airway and Oxygen Therapy: Patient Spontanous Breathing and Patient connected to supplemental oxygen  Post-op Assessment: Post-op Vital signs reviewed, Patient's Cardiovascular Status Stable, Respiratory Function Stable, Patent Airway and No signs of Nausea or vomiting  Post-op Vital Signs: Reviewed and stable  Complications: No notable events documented.

## 2022-07-01 NOTE — Discharge Instructions (Signed)

## 2022-07-01 NOTE — Op Note (Signed)
OPERATIVE NOTE  Terry Macias [id number] 07/01/2022   PREOPERATIVE DIAGNOSIS:    Nuclear Sclerotic Cataract Right eye with miotic pupil.        H25.11  POSTOPERATIVE DIAGNOSIS: Nuclear Sclerotic Cataract Right eye with miotic pupil.          PROCEDURE:  Phacoemusification with posterior chamber intraocular lens placement of the right eye which required pupil stretching with the Malyugin pupil expansion device. Ultrasound time: Procedure(s) with comments: CATARACT EXTRACTION PHACO AND INTRAOCULAR LENS PLACEMENT (IOC) RIGHT DIABETIC MALYUGIN 17.86 01:24.8 (Right) - diabetic  LENS:   Implant Name Type Inv. Item Serial No. Manufacturer Lot No. LRB No. Used Action  LENS IOL TECNIS EYHANCE 22.0 - [id number] Intraocular Lens LENS IOL TECNIS EYHANCE 22.0 [id number] [employer]  Right 1 Implanted        SURGEON:  Deirdre Evener, MD   ANESTHESIA:  Topical with tetracaine drops and 2% Xylocaine jelly, augmented with 1% preservative-free intracameral lidocaine.   COMPLICATIONS:  None.   DESCRIPTION OF PROCEDURE:  The patient was identified in the holding room and transported to the operating room and placed in the supine position under the operating microscope. Theright eye was identified as the operative eye and it was prepped and draped in the usual sterile ophthalmic fashion.   A 1 millimeter clear-corneal paracentesis was made at the 12:00 position.  0.5 ml of preservative-free 1% lidocaine was injected into the anterior chamber. The anterior chamber was filled with Viscoat viscoelastic.  A 2.4 millimeter keratome was used to make a near-clear corneal incision at the 9:00 position. A Malyugin pupil expander was then placed through the main incision and into the anterior chamber of the eye.  The edge of the iris was secured on the lip of the pupil expander and it was released, thereby expanding the pupil to approximately 7 millimeters for completion of the cataract surgery.   Additional Viscoat was placed in the anterior chamber.  A cystotome and capsulorrhexis forceps were used to make a curvilinear capsulorrhexis.   Balanced salt solution was used to hydrodissect and hydrodelineate the lens nucleus.   Phacoemulsification was used in stop and chop fashion to remove the lens, nucleus and epinucleus.  The remaining cortex was aspirated using the irrigation aspiration handpiece.  Additional Provisc was placed into the eye to distend the capsular bag for lens placement.  A lens was then injected into the capsular bag.  The pupil expanding ring was removed using a Kuglen hook and insertion device. The remaining viscoelastic was aspirated from the capsular bag and the anterior chamber.  The anterior chamber was filled with balanced salt solution to inflate to a physiologic pressure.  Wounds were hydrated with balanced salt solution.  The anterior chamber was inflated to a physiologic pressure with balanced salt solution.  No wound leaks were noted.Cefuroxime 0.1 ml of a 10mg /ml solution was injected into the anterior chamber for a dose of 1 mg of intracameral antibiotic at the completion of the case. Timolol and Brimonidine drops were applied to the eye.  The patient was taken to the recovery room in stable condition without complications of anesthesia or surgery.  Letizia Hook 07/01/2022, 7:59 AM

## 2022-07-01 NOTE — H&P (Signed)
[facility]   Primary Care Physician:  Marisue Ivan, MD Ophthalmologist: Dr. Lockie Mola  Pre-Procedure History & Physical: HPI:  Terry Macias is a 89 y.o. male here for ophthalmic surgery.   Past Medical History:  Diagnosis Date   CKD (chronic kidney disease), stage III (HCC)    Diabetes mellitus without complication (HCC)    GERD (gastroesophageal reflux disease)    Hx of major depression    Hypertension     Past Surgical History:  Procedure Laterality Date   COLONOSCOPY     TONSILLECTOMY     XI ROBOTIC ASSISTED INGUINAL HERNIA REPAIR WITH MESH Right 02/23/2020   Procedure: XI ROBOTIC ASSISTED INGUINAL HERNIA REPAIR WITH MESH;  Surgeon: Sung Amabile, DO;  Location: [facility];  Service: General;  Laterality: Right;    Prior to Admission medications   Medication Sig Start Date End Date Taking? Authorizing Provider  amLODipine (NORVASC) 5 MG tablet Take 5 mg by mouth daily.   Yes [provider]  atorvastatin (LIPITOR) 20 MG tablet Take 20 mg by mouth daily.   Yes [provider]  calamine lotion Apply 1 Application topically as needed for itching.   Yes [provider]  Cholecalciferol (VITAMIN D3) 50 MCG (2000 UT) TABS Take by mouth daily.   Yes [provider]  glimepiride (AMARYL) 4 MG tablet Take 4 mg by mouth 2 (two) times daily.   Yes [provider]  hydrocortisone cream 0.5 % Apply 1 Application topically as needed for itching.   Yes [provider]  ibuprofen (ADVIL) 400 MG tablet Take 2 tablets (800 mg total) by mouth every 8 (eight) hours as needed for mild pain or moderate pain. 02/23/20  Yes Sakai, Isami, DO  insulin aspart (NOVOLOG) 100 UNIT/ML injection Inject 15 Units into the skin 3 (three) times daily before meals.   Yes [provider]  insulin detemir (LEVEMIR) 100 UNIT/ML injection Inject 9 Units into the skin.   Yes [provider]  lisinopril (ZESTRIL) 40 MG  tablet Take 40 mg by mouth daily.   Yes [provider]  Magnesium Hydroxide (MILK OF MAGNESIA PO) Take by mouth as needed.   Yes [provider]  pantoprazole (PROTONIX) 20 MG tablet Take 20 mg by mouth daily.   Yes [provider]  PARoxetine (PAXIL) 20 MG tablet Take 20 mg by mouth daily.   Yes [provider]  Pramoxine-Zinc Acetate (CALACLEAR EX) Apply topically as needed.   Yes [provider]  HYDROcodone-acetaminophen (NORCO) 5-325 MG tablet Take 1 tablet by mouth every 6 (six) hours as needed for up to 6 doses for moderate pain. 02/23/20   Sung Amabile, DO    Allergies as of 06/18/2022 - Review Complete 11/17/2021  Allergen Reaction Noted   Dulaglutide Other (See Comments) 10/04/2017    History reviewed. No pertinent family history.  Social History   Socioeconomic History   Marital status: Widowed    Spouse name: Not on file   Number of children: Not on file   Years of education: Not on file   Highest education level: Not on file  Occupational History   Not on file  Tobacco Use   Smoking status: Never   Smokeless tobacco: Never  Vaping Use   Vaping Use: Never used  Substance and Sexual Activity   Alcohol use: Never   Drug use: Never   Sexual activity: Not on file  Other Topics Concern   Not on file  Social  History Narrative   Not on file   Social Determinants of Health   Financial Resource Strain: Not on file  Food Insecurity: Not on file  Transportation Needs: Not on file  Physical Activity: Not on file  Stress: Not on file  Social Connections: Not on file  Intimate Partner Violence: Not on file    Review of Systems: See HPI, otherwise negative ROS  Physical Exam: BP (!) 166/86   Pulse 64   Temp 97.7 F (36.5 C) (Temporal)   Resp 12   Ht 5' 7.99" (1.727 m)   Wt 65.8 kg   SpO2 100%   BMI 22.05 kg/m  General:   Alert,  pleasant and cooperative in NAD Head:  Normocephalic and atraumatic. Lungs:  Clear  to auscultation.    Heart:  Regular rate and rhythm.   Impression/Plan: Terry Macias is here for ophthalmic surgery.  Risks, benefits, limitations, and alternatives regarding ophthalmic surgery have been reviewed with the patient.  Questions have been answered.  All parties agreeable.   Lockie Mola, MD  07/01/2022, 7:29 AM

## 2022-07-01 NOTE — Anesthesia Postprocedure Evaluation (Signed)
Anesthesia Post Note  Patient: Terry Macias  Procedure(s) Performed: CATARACT EXTRACTION PHACO AND INTRAOCULAR LENS PLACEMENT (IOC) RIGHT DIABETIC MALYUGIN 17.86 01:24.8 (Right: Eye)  Patient location during evaluation: PACU Anesthesia Type: MAC Level of consciousness: awake and alert Pain management: pain level controlled Vital Signs Assessment: post-procedure vital signs reviewed and stable Respiratory status: spontaneous breathing, nonlabored ventilation, respiratory function stable and patient connected to nasal cannula oxygen Cardiovascular status: stable and blood pressure returned to baseline Postop Assessment: no apparent nausea or vomiting Anesthetic complications: no   No notable events documented.   Last Vitals:  Vitals:   07/01/22 0801 07/01/22 0806  BP: (!) 143/76 (!) 156/82  Pulse: (!) 58 (!) 58  Resp: 17 17  Temp: 36.5 C 36.4 C  SpO2: 97% 98%    Last Pain:  Vitals:   07/01/22 0806  TempSrc:   PainSc: 0-No pain                 Marisue Humble

## 2022-07-02 ENCOUNTER — Encounter: Payer: Self-pay | Admitting: Ophthalmology

## 2022-07-09 NOTE — Discharge Instructions (Signed)

## 2022-07-10 NOTE — Anesthesia Preprocedure Evaluation (Signed)
Anesthesia Evaluation  Patient identified by MRN, date of birth, ID band Patient awake    Reviewed: Allergy & Precautions, H&P , NPO status , Patient's Chart, lab work & pertinent test results  Airway Mallampati: I  TM Distance: >3 FB Neck ROM: Full    Dental no notable dental hx.    Pulmonary neg pulmonary ROS   Pulmonary exam normal breath sounds clear to auscultation       Cardiovascular hypertension, Normal cardiovascular exam Rhythm:Regular Rate:Normal     Neuro/Psych negative neurological ROS  negative psych ROS   GI/Hepatic Neg liver ROS,GERD  ,,  Endo/Other  negative endocrine ROSdiabetes    Renal/GU Renal disease  negative genitourinary   Musculoskeletal negative musculoskeletal ROS (+)    Abdominal   Peds negative pediatric ROS (+)  Hematology negative hematology ROS (+)   Anesthesia Other Findings Diabetes mellitus without complication Hypertension GERD  CKD (chronic kidney disease), stage III  Hx of major depression     Reproductive/Obstetrics negative OB ROS                              Anesthesia Physical Anesthesia Plan  ASA: 3  Anesthesia Plan: MAC   Post-op Pain Management:    Induction: Intravenous  PONV Risk Score and Plan:   Airway Management Planned: Natural Airway and Nasal Cannula  Additional Equipment:   Intra-op Plan:   Post-operative Plan:   Informed Consent: I have reviewed the patients History and Physical, chart, labs and discussed the procedure including the risks, benefits and alternatives for the proposed anesthesia with the patient or authorized representative who has indicated his/her understanding and acceptance.     Dental Advisory Given  Plan Discussed with: Anesthesiologist, CRNA and Surgeon  Anesthesia Plan Comments: (Patient consented for risks of anesthesia including but not limited to:  - adverse reactions to  medications - damage to eyes, teeth, lips or other oral mucosa - nerve damage due to positioning  - sore throat or hoarseness - Damage to heart, brain, nerves, lungs, other parts of body or loss of life  Patient voiced understanding.)         Anesthesia Quick Evaluation

## 2022-07-15 ENCOUNTER — Ambulatory Visit
Admission: RE | Admit: 2022-07-15 | Discharge: 2022-07-15 | Disposition: A | Discharge status: Home / Self Care | Payer: Medicare HMO | Attending: Ophthalmology | Admitting: Ophthalmology

## 2022-07-15 ENCOUNTER — Encounter
Admission: RE | Disposition: A | Discharge status: Home / Self Care | Payer: Self-pay | Source: Home / Self Care | Attending: Ophthalmology

## 2022-07-15 ENCOUNTER — Encounter: Payer: Self-pay | Admitting: Ophthalmology

## 2022-07-15 ENCOUNTER — Ambulatory Visit: Payer: Medicare HMO | Admitting: Anesthesiology

## 2022-07-15 ENCOUNTER — Other Ambulatory Visit: Payer: Self-pay

## 2022-07-15 DIAGNOSIS — H2512 Age-related nuclear cataract, left eye: Secondary | ICD-10-CM | POA: Insufficient documentation

## 2022-07-15 DIAGNOSIS — N183 Chronic kidney disease, stage 3 unspecified: Secondary | ICD-10-CM | POA: Insufficient documentation

## 2022-07-15 DIAGNOSIS — E1122 Type 2 diabetes mellitus with diabetic chronic kidney disease: Secondary | ICD-10-CM | POA: Diagnosis not present

## 2022-07-15 DIAGNOSIS — K219 Gastro-esophageal reflux disease without esophagitis: Secondary | ICD-10-CM | POA: Diagnosis not present

## 2022-07-15 DIAGNOSIS — I129 Hypertensive chronic kidney disease with stage 1 through stage 4 chronic kidney disease, or unspecified chronic kidney disease: Secondary | ICD-10-CM | POA: Insufficient documentation

## 2022-07-15 DIAGNOSIS — E1136 Type 2 diabetes mellitus with diabetic cataract: Secondary | ICD-10-CM | POA: Insufficient documentation

## 2022-07-15 HISTORY — PX: CATARACT EXTRACTION W/PHACO: SHX586

## 2022-07-15 LAB — GLUCOSE, CAPILLARY: Glucose-Capillary: 146 mg/dL — ABNORMAL HIGH (ref 70–99)

## 2022-07-15 SURGERY — PHACOEMULSIFICATION, CATARACT, WITH IOL INSERTION
Anesthesia: Monitor Anesthesia Care | Site: Eye | Laterality: Left

## 2022-07-15 MED ORDER — BRIMONIDINE TARTRATE-TIMOLOL 0.2-0.5 % OP SOLN
OPHTHALMIC | Status: DC | PRN
  Administered 2022-07-15: 1 [drp] via OPHTHALMIC

## 2022-07-15 MED ORDER — CEFUROXIME OPHTHALMIC INJECTION 1 MG/0.1 ML
INJECTION | OPHTHALMIC | Status: DC | PRN
  Administered 2022-07-15: .1 mL via INTRACAMERAL

## 2022-07-15 MED ORDER — SIGHTPATH DOSE#1 BSS IO SOLN
INTRAOCULAR | Status: DC | PRN
  Administered 2022-07-15: 2 mL

## 2022-07-15 MED ORDER — ARMC OPHTHALMIC DILATING DROPS
1.0000 | OPHTHALMIC | Status: DC | PRN
  Administered 2022-07-15 (×3): 1 via OPHTHALMIC

## 2022-07-15 MED ORDER — NA CHONDROIT SULF-NA HYALURON 40-30 MG/ML IO SOSY
INTRAOCULAR | Status: DC | PRN
  Administered 2022-07-15: .5 mL via INTRAOCULAR

## 2022-07-15 MED ORDER — MIDAZOLAM HCL 2 MG/2ML IJ SOLN
INTRAMUSCULAR | Status: DC | PRN
  Administered 2022-07-15: 1 mg via INTRAVENOUS

## 2022-07-15 MED ORDER — SIGHTPATH DOSE#1 NA HYALUR & NA CHOND-NA HYALUR IO KIT
PACK | INTRAOCULAR | Status: DC | PRN
  Administered 2022-07-15: 1 via OPHTHALMIC

## 2022-07-15 MED ORDER — SODIUM CHLORIDE 0.9% FLUSH
INTRAVENOUS | Status: DC | PRN
  Administered 2022-07-15: 10 mL via INTRAVENOUS

## 2022-07-15 MED ORDER — SIGHTPATH DOSE#1 BSS IO SOLN
INTRAOCULAR | Status: DC | PRN
  Administered 2022-07-15: 96 mL via OPHTHALMIC

## 2022-07-15 MED ORDER — SIGHTPATH DOSE#1 BSS IO SOLN
INTRAOCULAR | Status: DC | PRN
  Administered 2022-07-15: 15 mL via INTRAOCULAR

## 2022-07-15 MED ORDER — TETRACAINE HCL 0.5 % OP SOLN
1.0000 [drp] | OPHTHALMIC | Status: DC | PRN
  Administered 2022-07-15 (×3): 1 [drp] via OPHTHALMIC

## 2022-07-15 MED ORDER — FENTANYL CITRATE (PF) 100 MCG/2ML IJ SOLN
INTRAMUSCULAR | Status: DC | PRN
  Administered 2022-07-15: 25 ug via INTRAVENOUS

## 2022-07-15 SURGICAL SUPPLY — 19 items
CANNULA ANT/CHMB 27G (MISCELLANEOUS) IMPLANT
CANNULA ANT/CHMB 27GA (MISCELLANEOUS) IMPLANT
CATARACT SUITE SIGHTPATH (MISCELLANEOUS) ×1 IMPLANT
FEE CATARACT SUITE SIGHTPATH (MISCELLANEOUS) ×1 IMPLANT
GLOVE SRG 8 PF TXTR STRL LF DI (GLOVE) ×1 IMPLANT
GLOVE SURG ENC TEXT LTX SZ7.5 (GLOVE) ×1 IMPLANT
GLOVE SURG GAMMEX PI TX LF 7.5 (GLOVE) IMPLANT
GLOVE SURG UNDER POLY LF SZ8 (GLOVE) ×1
LENS IOL TECNIS EYHANCE 22.5 (Intraocular Lens) IMPLANT
NDL FILTER BLUNT 18X1 1/2 (NEEDLE) ×1 IMPLANT
NDL RETROBULBAR .5 NSTRL (NEEDLE) IMPLANT
NEEDLE FILTER BLUNT 18X1 1/2 (NEEDLE) ×1 IMPLANT
PACK VIT ANT 23G (MISCELLANEOUS) IMPLANT
RING MALYGIN 7.0 (MISCELLANEOUS) IMPLANT
SUT ETHILON 10-0 CS-B-6CS-B-6 (SUTURE)
SUT VICRYL  9 0 (SUTURE)
SUT VICRYL 9 0 (SUTURE) IMPLANT
SUTURE EHLN 10-0 CS-B-6CS-B-6 (SUTURE) IMPLANT
SYR 3ML LL SCALE MARK (SYRINGE) ×1 IMPLANT

## 2022-07-15 NOTE — Transfer of Care (Signed)
Immediate Anesthesia Transfer of Care Note  Patient: Terry Macias  Procedure(s) Performed: CATARACT EXTRACTION PHACO AND INTRAOCULAR LENS PLACEMENT (IOC) LEFT DIABETIC, MALYUGIN, VISCOAT 22.76 01:41.2 (Left: Eye)  Patient Location: PACU  Anesthesia Type:MAC  Level of Consciousness: awake, alert , and oriented  Airway & Oxygen Therapy: Patient Spontanous Breathing  Post-op Assessment: Report given to RN and Post -op Vital signs reviewed and stable  Post vital signs: Reviewed and stable  Last Vitals: SEE FLOW SHEET FOR PACU   Vitals Value Taken Time  BP 140/78 07/15/22 0832  Temp    Pulse 56 07/15/22 0833  Resp 19 07/15/22 0833  SpO2 96 % 07/15/22 0833  Vitals shown include unvalidated device data.  Last Pain:  Vitals:   07/15/22 0712  TempSrc: Temporal  PainSc: 0-No pain         Complications: No notable events documented.

## 2022-07-15 NOTE — H&P (Signed)
[facility]   Primary Care Physician:  Marisue Ivan, MD Ophthalmologist: Dr. Lockie Mola  Pre-Procedure History & Physical: HPI:  Terry Macias is a 89 y.o. male here for ophthalmic surgery.   Past Medical History:  Diagnosis Date   CKD (chronic kidney disease), stage III (HCC)    Diabetes mellitus without complication (HCC)    GERD (gastroesophageal reflux disease)    Hx of major depression    Hypertension     Past Surgical History:  Procedure Laterality Date   CATARACT EXTRACTION W/PHACO Right 07/01/2022   Procedure: CATARACT EXTRACTION PHACO AND INTRAOCULAR LENS PLACEMENT (IOC) RIGHT DIABETIC MALYUGIN 17.86 01:24.8;  Surgeon: Lockie Mola, MD;  Location: [facility];  Service: Ophthalmology;  Laterality: Right;  diabetic   COLONOSCOPY     TONSILLECTOMY     XI ROBOTIC ASSISTED INGUINAL HERNIA REPAIR WITH MESH Right 02/23/2020   Procedure: XI ROBOTIC ASSISTED INGUINAL HERNIA REPAIR WITH MESH;  Surgeon: Sung Amabile, DO;  Location: [facility];  Service: General;  Laterality: Right;    Prior to Admission medications   Medication Sig Start Date End Date Taking? Authorizing Provider  amLODipine (NORVASC) 5 MG tablet Take 5 mg by mouth daily.   Yes [provider]  atorvastatin (LIPITOR) 20 MG tablet Take 20 mg by mouth daily.   Yes [provider]  calamine lotion Apply 1 Application topically as needed for itching.   Yes [provider]  Cholecalciferol (VITAMIN D3) 50 MCG (2000 UT) TABS Take by mouth daily.   Yes [provider]  glimepiride (AMARYL) 4 MG tablet Take 4 mg by mouth 2 (two) times daily.   Yes [provider]  HYDROcodone-acetaminophen (NORCO) 5-325 MG tablet Take 1 tablet by mouth every 6 (six) hours as needed for up to 6 doses for moderate pain. 02/23/20  Yes Sakai, Isami, DO  hydrocortisone cream 0.5 % Apply 1 Application topically as needed for itching.   Yes [provider]  ibuprofen (ADVIL) 400 MG tablet Take 2 tablets (800 mg total) by mouth every 8 (eight) hours as needed for mild pain or moderate pain. 02/23/20  Yes Sakai, Isami, DO  insulin aspart (NOVOLOG) 100 UNIT/ML injection Inject 15 Units into the skin 3 (three) times daily before meals.   Yes [provider]  insulin detemir (LEVEMIR) 100 UNIT/ML injection Inject 9 Units into the skin.   Yes [provider]  lisinopril (ZESTRIL) 40 MG tablet Take 40 mg by mouth daily.   Yes [provider]  Magnesium Hydroxide (MILK OF MAGNESIA PO) Take by mouth as needed.   Yes [provider]  pantoprazole (PROTONIX) 20 MG tablet Take 20 mg by mouth daily.   Yes [provider]  PARoxetine (PAXIL) 20 MG tablet Take 20 mg by mouth daily.   Yes [provider]  Pramoxine-Zinc Acetate (CALACLEAR EX) Apply topically as needed.   Yes [provider]    Allergies as of 06/18/2022 - Review Complete 11/17/2021  Allergen Reaction Noted   Dulaglutide Other (See Comments) 10/04/2017    History reviewed. No pertinent family history.  Social History   Socioeconomic History   Marital status: Widowed    Spouse name: Not on file   Number of children: Not on file   Years of education: Not on file   Highest education level: Not on file  Occupational History   Not on file  Tobacco Use   Smoking status: Never   Smokeless tobacco: Never  Vaping Use   Vaping Use: Never used  Substance and Sexual Activity   Alcohol use: Never   Drug use: Never   Sexual activity: Not on file  Other Topics Concern   Not on file  Social History Narrative   Not on file   Social Determinants of Health   Financial Resource Strain: Not on file  Food Insecurity: Not on file  Transportation Needs: Not on file  Physical Activity: Not on file  Stress: Not on file  Social Connections: Not on file  Intimate Partner Violence: Not on file    Review of Systems: See HPI,  otherwise negative ROS  Physical Exam: BP (!) 143/76   Pulse 64   Resp 18   Ht 5\' 7"  (1.702 m)   Wt 68.7 kg   SpO2 97%   BMI 23.73 kg/m  General:   Alert,  pleasant and cooperative in NAD Head:  Normocephalic and atraumatic. Lungs:  Clear to auscultation.    Heart:  Regular rate and rhythm.   Impression/Plan: Terry Macias is here for ophthalmic surgery.  Risks, benefits, limitations, and alternatives regarding ophthalmic surgery have been reviewed with the patient.  Questions have been answered.  All parties agreeable.   Lockie Mola, MD  07/15/2022, 7:34 AM

## 2022-07-15 NOTE — Anesthesia Postprocedure Evaluation (Signed)
Anesthesia Post Note  Patient: Terry Macias  Procedure(s) Performed: CATARACT EXTRACTION PHACO AND INTRAOCULAR LENS PLACEMENT (IOC) LEFT DIABETIC, MALYUGIN, VISCOAT 22.76 01:41.2 (Left: Eye)  Patient location during evaluation: PACU Anesthesia Type: MAC Level of consciousness: awake and alert Pain management: pain level controlled Vital Signs Assessment: post-procedure vital signs reviewed and stable Respiratory status: spontaneous breathing, nonlabored ventilation, respiratory function stable and patient connected to nasal cannula oxygen Cardiovascular status: stable and blood pressure returned to baseline Postop Assessment: no apparent nausea or vomiting Anesthetic complications: no   No notable events documented.   Last Vitals:  Vitals:   07/15/22 0717 07/15/22 0831  BP: (!) 143/76 (!) 140/78  Pulse:  72  Resp:  18  Temp:  (!) 36.1 C  SpO2:  96%    Last Pain:  Vitals:   07/15/22 0836  TempSrc:   PainSc: 0-No pain                 Marisue Humble

## 2022-07-15 NOTE — Op Note (Signed)
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     PROCEDURE:  Phacoemulsification with posterior chamber intraocular lens implantation of the left eye which required pupil stretching with the Malyugin pupil expansion device  Ultrasound time: Procedure(s) with comments: CATARACT EXTRACTION PHACO AND INTRAOCULAR LENS PLACEMENT (IOC) LEFT DIABETIC, MALYUGIN, VISCOAT 22.76 01:41.2 (Left) - Diabetic  LENS:   Implant Name Type Inv. Item Serial No. Manufacturer Lot No. LRB No. Used Action  LENS IOL TECNIS EYHANCE 22.5 - [id number] Intraocular Lens LENS IOL TECNIS EYHANCE 22.5 [id number] [employer]  Left 1 Implanted          SURGEON:  Deirdre Evener, MD   ANESTHESIA: Topical with tetracaine drops and 2% Xylocaine jelly, augmented with 1% preservative-free intracameral lidocaine.   COMPLICATIONS:  None.   DESCRIPTION OF PROCEDURE:  The patient was identified in the holding room and transported to the operating room and placed in the supine position under the operating microscope.  The left eye was identified as the operative eye and it was prepped and draped in the usual sterile ophthalmic fashion.   A 1 millimeter clear-corneal paracentesis was made at the 1:30 position.  The anterior chamber was filled with Viscoat viscoelastic.  0.5 ml of preservative-free 1% lidocaine was injected into the anterior chamber.  A 2.4 millimeter keratome was used to make a near-clear corneal incision at the 10:30 position.  A Malyugin pupil expander was then placed through the main incision and into the anterior chamber of the eye.  The edge of the iris was secured on the lip of the pupil expander and it was released, thereby expanding the pupil to approximately 7 millimeters for completion of the cataract surgery.   Additional Viscoat was placed in the anterior chamber.  A cystotome and capsulorrhexis forceps were used to make a curvilinear capsulorrhexis.   Balanced salt solution was used to hydrodissect and hydrodelineate the lens nucleus.   Phacoemulsification was used in stop and chop fashion to remove the lens, nucleus and epinucleus.  The remaining cortex was aspirated using the irrigation aspiration handpiece.  Additional Provisc was placed into the eye to distend the capsular bag for lens placement.  A lens was then injected into the capsular bag.  The pupil expanding ring was removed using a Kuglen hook and insertion device. The remaining viscoelastic was aspirated from the capsular bag and the anterior chamber.  The anterior chamber was filled with balanced salt solution to inflate to a physiologic pressure.   Wounds were hydrated with balanced salt solution.  The anterior chamber was inflated to a physiologic pressure with balanced salt solution.  No wound leaks were noted. Cefuroxime 0.1 ml of a 10mg /ml solution was injected into the anterior chamber for a dose of 1 mg of intracameral antibiotic at the completion of the case.   Timolol and Brimonidine drops were applied to the eye.  The patient was taken to the recovery room in stable condition without complications of anesthesia or surgery.  [name] 07/15/2022, 8:31 AM

## 2022-07-16 ENCOUNTER — Encounter: Payer: Self-pay | Admitting: Ophthalmology
